# Patient Record
Sex: Female | Born: 1960 | Race: White | Hispanic: No | Smoking: Never smoker
Health system: Southern US, Community
[De-identification: ages and names within clinical notes are randomized; demographics above are authoritative.]

## PROBLEM LIST (undated history)

## (undated) DIAGNOSIS — R519 Headache, unspecified: Secondary | ICD-10-CM

## (undated) DIAGNOSIS — R51 Headache: Secondary | ICD-10-CM

## (undated) DIAGNOSIS — D649 Anemia, unspecified: Secondary | ICD-10-CM

## (undated) DIAGNOSIS — J45909 Unspecified asthma, uncomplicated: Secondary | ICD-10-CM

## (undated) DIAGNOSIS — Z87442 Personal history of urinary calculi: Secondary | ICD-10-CM

## (undated) DIAGNOSIS — T4145XA Adverse effect of unspecified anesthetic, initial encounter: Secondary | ICD-10-CM

## (undated) DIAGNOSIS — K219 Gastro-esophageal reflux disease without esophagitis: Secondary | ICD-10-CM

## (undated) DIAGNOSIS — T8859XA Other complications of anesthesia, initial encounter: Secondary | ICD-10-CM

## (undated) DIAGNOSIS — I10 Essential (primary) hypertension: Secondary | ICD-10-CM

## (undated) DIAGNOSIS — Z9889 Other specified postprocedural states: Secondary | ICD-10-CM

## (undated) DIAGNOSIS — C801 Malignant (primary) neoplasm, unspecified: Secondary | ICD-10-CM

## (undated) DIAGNOSIS — R112 Nausea with vomiting, unspecified: Secondary | ICD-10-CM

## (undated) DIAGNOSIS — M1990 Unspecified osteoarthritis, unspecified site: Secondary | ICD-10-CM

## (undated) HISTORY — PX: MOHS SURGERY: SUR867

---

## 2004-11-30 ENCOUNTER — Ambulatory Visit: Payer: Self-pay | Admitting: Family Medicine

## 2004-12-04 ENCOUNTER — Ambulatory Visit: Payer: Self-pay | Admitting: Family Medicine

## 2006-03-05 ENCOUNTER — Observation Stay: Payer: Self-pay | Admitting: Obstetrics and Gynecology

## 2006-04-23 ENCOUNTER — Observation Stay: Payer: Self-pay | Admitting: Obstetrics and Gynecology

## 2006-04-29 ENCOUNTER — Observation Stay: Payer: Self-pay | Admitting: Obstetrics and Gynecology

## 2006-05-05 ENCOUNTER — Observation Stay: Payer: Self-pay | Admitting: Obstetrics and Gynecology

## 2006-05-10 ENCOUNTER — Observation Stay: Payer: Self-pay | Admitting: Obstetrics and Gynecology

## 2006-05-17 ENCOUNTER — Observation Stay: Payer: Self-pay | Admitting: Obstetrics and Gynecology

## 2006-05-18 ENCOUNTER — Inpatient Hospital Stay: Payer: Self-pay | Admitting: Obstetrics and Gynecology

## 2006-05-21 ENCOUNTER — Inpatient Hospital Stay: Payer: Self-pay | Admitting: Obstetrics and Gynecology

## 2008-01-09 ENCOUNTER — Ambulatory Visit: Payer: Self-pay | Admitting: Internal Medicine

## 2008-03-31 ENCOUNTER — Ambulatory Visit: Payer: Self-pay

## 2008-05-27 ENCOUNTER — Emergency Department: Payer: Self-pay | Admitting: Emergency Medicine

## 2008-12-24 ENCOUNTER — Emergency Department: Payer: Self-pay | Admitting: Emergency Medicine

## 2009-11-09 ENCOUNTER — Inpatient Hospital Stay: Payer: Self-pay | Admitting: Internal Medicine

## 2009-11-29 ENCOUNTER — Ambulatory Visit: Payer: Self-pay | Admitting: Family Medicine

## 2010-05-20 ENCOUNTER — Emergency Department: Payer: Self-pay | Admitting: Emergency Medicine

## 2011-11-06 ENCOUNTER — Ambulatory Visit: Payer: Self-pay

## 2012-07-30 ENCOUNTER — Ambulatory Visit: Payer: Self-pay | Admitting: Family Medicine

## 2012-09-29 ENCOUNTER — Ambulatory Visit: Payer: Self-pay | Admitting: Obstetrics and Gynecology

## 2012-09-29 LAB — GLUCOSE, RANDOM: Glucose: 117 mg/dL — ABNORMAL HIGH (ref 65–99)

## 2012-11-19 ENCOUNTER — Ambulatory Visit: Payer: Self-pay

## 2013-08-25 ENCOUNTER — Emergency Department: Payer: Self-pay | Admitting: Emergency Medicine

## 2013-08-25 ENCOUNTER — Ambulatory Visit: Payer: Self-pay | Admitting: Family Medicine

## 2013-08-25 LAB — CBC
HCT: 41.1 % (ref 35.0–47.0)
HGB: 13.8 g/dL (ref 12.0–16.0)
MCH: 29.3 pg (ref 26.0–34.0)
MCHC: 33.6 g/dL (ref 32.0–36.0)
MCV: 87 fL (ref 80–100)
PLATELETS: 225 10*3/uL (ref 150–440)
RBC: 4.71 10*6/uL (ref 3.80–5.20)
RDW: 13 % (ref 11.5–14.5)
WBC: 6.8 10*3/uL (ref 3.6–11.0)

## 2013-08-25 LAB — BASIC METABOLIC PANEL
Anion Gap: 4 — ABNORMAL LOW (ref 7–16)
BUN: 9 mg/dL (ref 7–18)
Calcium, Total: 8.4 mg/dL — ABNORMAL LOW (ref 8.5–10.1)
Chloride: 103 mmol/L (ref 98–107)
Co2: 28 mmol/L (ref 21–32)
Creatinine: 0.69 mg/dL (ref 0.60–1.30)
EGFR (Non-African Amer.): >60
GLUCOSE: 86 mg/dL (ref 65–99)
Osmolality: 268 (ref 275–301)
POTASSIUM: 3.6 mmol/L (ref 3.5–5.1)
SODIUM: 135 mmol/L — AB (ref 136–145)

## 2013-08-25 LAB — TROPONIN I

## 2016-10-15 NOTE — H&P (Signed)
Makayla Gonzalez returns for follow up from initial possible PMB visit.  Has had sequential 39mo amenorrhea cycles and a heavy long bleed each time.  This time the bleeding lasted several weeks and finally resolved over the weekend. We drew menopause labs:  Component     Latest Ref Rng & Units 10/11/2016  WBC     4.1 - 10.2 10^3/uL 9.7  RBC     4.04 - 5.48 10^6/uL 4.89  Hemoglobin     12.0 - 15.0 gm/dL 14.6  Hematocrit     35.0 - 47.0 % 42.4  MCV     80.0 - 100.0 fl 86.7  MCH (Mean Corpuscular Hemoglobin)     27.0 - 31.2 pg 29.9  MCHC     32.0 - 36.0 gm/dL 34.4  Platelet Count     150 - 450 10^3/uL 324  RDW-CV (Red Cell Distribution Width)     11.6 - 14.8 % 13.2  MPV     9.4 - 12.4 fl 10.2  Neutrophils     1.50 - 7.80 10^3/uL 5.29  Lymphocyte Count     1.00 - 3.60 10^3/uL 3.48  Monocyte Count     0.00 - 1.50 10^3/uL 0.67  Eosinophils     0.00 - 0.55 10^3/uL 0.19  Basophils     0.00 - 0.09 10^3/uL 0.07  Neutrophil %     32.0 - 70.0 % 54.3  Lymphocyte %     10.0 - 50.0 % 35.8  Monocyte %     4.0 - 13.0 % 6.9  Eosinophil %     1.0 - 5.0 % 2.0  Basophil%     0.0 - 2.0 % 0.7  Immature Granulocyte %     <=0.7 % 0.3  Immature Granulocyte Count     <=0.06 10:3/L 0.03  LH - [facility]     mIU/mL 36.7  FSH - [facility]     mIU/mL 55.1  HCG Quant, Serum Pregnancy     <5.0 mIU/ml 3.8  TSH     0.450-5.330 uIU/ml uIU/mL 2.806   These show she is likely in menopause.  TVUS today shows: Anteverted 8 x 4 x 4cm uterus, with a 3x2x3cm ant fibroid and 1.24cm EE LO 2x2x1cm RO 2x1x1cm No ff Bilateral adnexa without masses                         Problem List Date Reviewed:07/08/15      Codes Priority Class Noted - Resolved   Abdominal cramping, unspecified ICD-10-CM: R10.9 ICD-9-CM: 789.00   06/02/2015 - Present   GERD without esophagitis ICD-10-CM: K21.9 ICD-9-CM: 530.81   06/02/2015 - Present   Bloody diarrhea, unspecified ICD-10-CM:  R19.7 ICD-9-CM: 787.91   06/02/2015 - Present   Anxiety and depression ICD-10-CM: F41.9, F32.9 ICD-9-CM: 300.00, 311   03/08/2013 - Present   Hypertension ICD-10-CM: I10 ICD-9-CM: 401.9   Unknown - Present   Cancer- basal cell ICD-10-CM: C80.1 ICD-9-CM: 199.1   Unknown - Present   Overview Signed 03/05/2013 11:06 AM by Denyse Amass, MD    basal cell has had mohs surgery      Asthma, unspecified ICD-10-CM: J45.909 ICD-9-CM: 493.90   03/05/2013 - Present   Overview Signed 03/05/2013 11:09 AM by Denyse Amass, MD    Dr. Chancy Milroy, pulmonogy       RESOLVED: Diabetes mellitus type 2, uncomplicated (CMS-HCC) TGG-26-RS: E11.9 ICD-9-CM: 250.00   Unknown - 05/30/2015      Past Medical History:has a  past medical history of Cancer (CMS-HCC); Claustrophobia; Depression, unspecified; History of kidney stones (2011); History of motion sickness; History of snoring; Hypertension; Morbid obesity with BMI of 45.0-49.9, adult (CMS-HCC); and Wears glasses. Past Surgical History:has a past surgical history that includes Cesarean section; Mohs surgery; right foot skin lesion removal; wisdom teeth extraction; esophagogastrodoudenoscopy w/biopsy (N/A, 06/17/2015); colonoscopy w/biopsy (N/A, 06/17/2015); and Tubal ligation. Family History:family history includes Alzheimer's disease in her mother; Coronary Artery Disease (Blocked arteries around heart) in her father; Diabetes type II in her father; High blood pressure (Hypertension) in her mother; Skin cancer in her mother. Social History:reports that she has never smoked. She has never used smokeless tobacco. She reports that she does not drink alcohol or use drugs. OB/GYN History:         OB History   Gravida Para Term Preterm AB Living   2 2 2   2    SAB TAB Ectopic Molar Multiple Live Births        2      Allergies:has No Known Allergies. Medications:  Current Outpatient  Prescriptions:  . albuterol 90 mcg/actuation inhaler, Inhale 2 inhalations into the lungs every 6 (six) hours as needed for Wheezing. Reported on 06/10/2015 , Disp: , Rfl:  . cetirizine (ZYRTEC) 10 mg capsule, Take 10 mg by mouth as needed. Reported on 06/10/2015 , Disp: , Rfl:  . FLUoxetine (PROZAC) 40 MG capsule, Take 80 mg by mouth once daily., Disp: , Rfl:  . fluticasone-salmeterol (ADVAIR DISKUS) 250-50 mcg/dose diskus inhaler, Inhale 1 inhalation into the lungs every 12 (twelve) hours. Per dr. Chancy Milroy, intermittently, Disp: 1 Inhaler, Rfl: 5 . losartan (COZAAR) 50 MG tablet, TAKE 1 TABLET BY MOUTH EVERY DAY, Disp: 100 tablet, Rfl: 0 . omeprazole (PRILOSEC OTC) 20 MG tablet, Take 20 mg by mouth 2 (two) times daily., Disp: , Rfl:  . FLUoxetine (PROZAC) 40 MG capsule, Take 1 capsule (40 mg total) by mouth 2 (two) times daily. MUST MAKE APPT FOR ANY FURTHER REFILLS., Disp: 60 capsule, Rfl: 0 . losartan (COZAAR) 50 MG tablet, Take 1 tablet (50 mg total) by mouth once daily., Disp: 30 tablet, Rfl: 3  Review of Systems: General: No fatigue or weightloss Eyes:No vision changes Ears:No hearing difficulty Respiratory:No cough or shortness of breath Pulmonary: No asthma or shortness of breath Cardiovascular:No chest pain, palpitations, dyspnea on exertion Gastrointestinal:No abdominal bloating, chronic diarrhea, constipation, masses, pain or hematochezia Genitourinary:No hematuria, dysuria, abnormal vaginal discharge, pelvic pain, Menometrorrhagia Lymphatic:No swollen lymph nodes Musculoskeletal:No muscle weakness Neurologic:No extremity weakness, syncope, seizure disorder Psychiatric:No history of depression, delusions or suicidal/homicidal ideation      BP 134/64   Pulse (!)  112   Ht 162.6 cm (5\' 4" )   Wt (!) 130.9 kg (288 lb 9.6 oz)   BMI 49.54 kg/m    WDWN white female in NAD   Lungs: CTA  CV : RRR without murmur   Abdomen: soft , no mass, normal active bowel sounds,  non-tender, no rebound tenderness Pelvic: tanner stage 5 ,              External genitalia: vulva /labia no lesions             Urethra: no prolapse, no diverticulum, no caruncle             Bladder: no tenderness to palpation, no cystocele             Vagina: normal physiologic d/c, no lesions, normal apical support  Cervix: no lesions, no cervical motion tenderness               Uterus: normal size shape and contour, non-tender, mobile, exam limited by habitus             Adnexa: no masses bilaterally, non-tender  exam limited by habitus                Endometrial biopsy done today  Assessment: 55yo with PMB  Plan: She is at wits end and does not want to experience this cycle again.  She would like a hysterectomy; many of her sisters have had them and she is confident this is what she wants.   EMB collected today and will determine if there is any concern for malignancy.  I'm happy to offer her a hysterectomy and BS, citing patient autonomy.  We discussed the all-cause mortality of removing her ovaries prior to age 61.  She would like to discuss with her family before making this part of the decision, but would like to proceed with scheduling the surgery.  The patient and I discussed the technical aspects of the procedure including the potential for risks and complications.These include but are not limited to the risk of infection requiring post-operative antibiotics or further procedures.We talked about the risk of injury to adjacent organs including bladder, bowel, ureter, blood vessels or nerves, the need to convert to an open incision, possibleneed for blood transfusion andpostop complications such asthromboembolic or cardiopulmonary complications.All of her  questions were answered. Her preoperative exam was completed andthe appropriate consents were signed. She is scheduled to undergo this procedure in the near future.   Urbano Milhouse CRIST Reem Fleury, MD

## 2016-10-17 ENCOUNTER — Encounter
Admission: RE | Admit: 2016-10-17 | Discharge: 2016-10-17 | Disposition: A | Discharge status: Home / Self Care | Payer: BLUE CROSS/BLUE SHIELD | Source: Ambulatory Visit | Attending: Obstetrics & Gynecology | Admitting: Obstetrics & Gynecology

## 2016-10-17 ENCOUNTER — Encounter: Payer: Self-pay | Admitting: *Deleted

## 2016-10-17 DIAGNOSIS — I4581 Long QT syndrome: Secondary | ICD-10-CM | POA: Insufficient documentation

## 2016-10-17 DIAGNOSIS — Z01818 Encounter for other preprocedural examination: Secondary | ICD-10-CM | POA: Insufficient documentation

## 2016-10-17 DIAGNOSIS — N950 Postmenopausal bleeding: Secondary | ICD-10-CM | POA: Insufficient documentation

## 2016-10-17 DIAGNOSIS — I10 Essential (primary) hypertension: Secondary | ICD-10-CM | POA: Insufficient documentation

## 2016-10-17 DIAGNOSIS — N938 Other specified abnormal uterine and vaginal bleeding: Secondary | ICD-10-CM | POA: Insufficient documentation

## 2016-10-17 HISTORY — DX: Other specified postprocedural states: Z98.890

## 2016-10-17 HISTORY — DX: Headache: R51

## 2016-10-17 HISTORY — DX: Malignant (primary) neoplasm, unspecified: C80.1

## 2016-10-17 HISTORY — DX: Other complications of anesthesia, initial encounter: T88.59XA

## 2016-10-17 HISTORY — DX: Gastro-esophageal reflux disease without esophagitis: K21.9

## 2016-10-17 HISTORY — DX: Unspecified osteoarthritis, unspecified site: M19.90

## 2016-10-17 HISTORY — DX: Anemia, unspecified: D64.9

## 2016-10-17 HISTORY — DX: Unspecified asthma, uncomplicated: J45.909

## 2016-10-17 HISTORY — DX: Nausea with vomiting, unspecified: R11.2

## 2016-10-17 HISTORY — DX: Adverse effect of unspecified anesthetic, initial encounter: T41.45XA

## 2016-10-17 HISTORY — DX: Personal history of urinary calculi: Z87.442

## 2016-10-17 HISTORY — DX: Essential (primary) hypertension: I10

## 2016-10-17 HISTORY — DX: Headache, unspecified: R51.9

## 2016-10-17 NOTE — Patient Instructions (Signed)
  Your procedure is scheduled on: 10-19-16 (Friday) Report to Same Day Surgery 2nd floor medical mall [facility] Entrance-take elevator on left to 2nd floor.  Check in with surgery information desk.) To find out your arrival time please call [phone] between 1PM - 3PM on 10-18-16 (Thursday)  Remember: Instructions that are not followed completely may result in serious medical risk, up to and including death, or upon the discretion of your surgeon and anesthesiologist your surgery may need to be rescheduled.    _x___ 1. Do not eat food or drink liquids after midnight. No gum chewing or hard candies.     __x__ 2. No Alcohol for 24 hours before or after surgery.   __x__3. No Smoking for 24 prior to surgery.   ____  4. Bring all medications with you on the day of surgery if instructed.    __x__ 5. Notify your doctor if there is any change in your medical condition     (cold, fever, infections).     Do not wear jewelry, make-up, hairpins, clips or nail polish.  Do not wear lotions, powders, or perfumes. You may wear deodorant.  Do not shave 48 hours prior to surgery. Men may shave face and neck.  Do not bring valuables to the hospital.    [facility] is not responsible for any belongings or valuables.               Contacts, dentures or bridgework may not be worn into surgery.  Leave your suitcase in the car. After surgery it may be brought to your room.  For patients admitted to the hospital, discharge time is determined by your treatment team.   Patients discharged the day of surgery will not be allowed to drive home.  You will need someone to drive you home and stay with you the night of your procedure.    Please read over the following fact sheets that you were given:     _x___ [place] WITH A SMALL SIP OF WATER. These include:  1. PROZAC  2. LOSARTAN  3. PRILOSEC  4. TAKE AN EXTRA PRILOSEC ON Thursday NIGHT BEFORE  BED  5.  6.  ____Fleets enema or Magnesium Citrate as directed.   _x___ Use CHG Soap or sage wipes as directed on instruction sheet   _X___ Use inhalers on the day of surgery and bring to hospital day of surgery-USE ALBUTEROL INHALER AT [place]  ____ Stop Metformin and Janumet 2 days prior to surgery.    ____ Take 1/2 of usual insulin dose the night before surgery and none on the morning surgery.   ____ Follow recommendations from Cardiologist, Pulmonologist or PCP regarding stopping Aspirin, Coumadin, Pllavix ,Eliquis, Effient, or Pradaxa, and Pletal.  X____Stop Anti-inflammatories such as Advil, Aleve, Ibuprofen, Motrin, Naproxen, Naprosyn, Goodies powders or aspirin products NOW-OK to take Tylenol    ____ Stop supplements until after surgery.    ____ Bring C-Pap to the hospital.

## 2016-10-18 ENCOUNTER — Encounter
Admission: RE | Admit: 2016-10-18 | Discharge: 2016-10-18 | Disposition: A | Discharge status: Home / Self Care | Payer: BLUE CROSS/BLUE SHIELD | Source: Ambulatory Visit | Attending: Obstetrics & Gynecology | Admitting: Obstetrics & Gynecology

## 2016-10-18 DIAGNOSIS — Z01818 Encounter for other preprocedural examination: Secondary | ICD-10-CM | POA: Diagnosis present

## 2016-10-18 DIAGNOSIS — N950 Postmenopausal bleeding: Secondary | ICD-10-CM | POA: Diagnosis not present

## 2016-10-18 DIAGNOSIS — N938 Other specified abnormal uterine and vaginal bleeding: Secondary | ICD-10-CM | POA: Diagnosis not present

## 2016-10-18 DIAGNOSIS — I10 Essential (primary) hypertension: Secondary | ICD-10-CM | POA: Diagnosis not present

## 2016-10-18 DIAGNOSIS — I4581 Long QT syndrome: Secondary | ICD-10-CM | POA: Diagnosis not present

## 2016-10-18 LAB — BASIC METABOLIC PANEL
Anion gap: 10 (ref 5–15)
BUN: 10 mg/dL (ref 6–20)
CHLORIDE: 100 mmol/L — AB (ref 101–111)
CO2: 28 mmol/L (ref 22–32)
Calcium: 8.8 mg/dL — ABNORMAL LOW (ref 8.9–10.3)
Creatinine, Ser: 0.78 mg/dL (ref 0.44–1.00)
GFR calc Af Amer: >60 mL/min (ref >60)
GFR calc non Af Amer: >60 mL/min (ref >60)
GLUCOSE: 117 mg/dL — AB (ref 65–99)
POTASSIUM: 3.7 mmol/L (ref 3.5–5.1)
Sodium: 138 mmol/L (ref 135–145)

## 2016-10-18 LAB — TYPE AND SCREEN
ABO/RH(D): O POS
Antibody Screen: NEGATIVE

## 2016-10-18 LAB — CBC
HEMATOCRIT: 39.9 % (ref 35.0–47.0)
Hemoglobin: 13.5 g/dL (ref 12.0–16.0)
MCH: 29.2 pg (ref 26.0–34.0)
MCHC: 33.8 g/dL (ref 32.0–36.0)
MCV: 86.5 fL (ref 80.0–100.0)
Platelets: 229 10*3/uL (ref 150–440)
RBC: 4.61 MIL/uL (ref 3.80–5.20)
RDW: 14.1 % (ref 11.5–14.5)
WBC: 5.1 10*3/uL (ref 3.6–11.0)

## 2016-10-18 MED ORDER — CEFAZOLIN SODIUM-DEXTROSE 2-4 GM/100ML-% IV SOLN: 2.0000 g | Freq: Once | INTRAVENOUS | Status: DC

## 2016-10-18 NOTE — Pre-Procedure Instructions (Signed)
CALLED DR Rosey Bath ABOUT ABNORMAL EKG- DR Rosey Bath COMPARED TODAYS EKG WITH 2015 EKG AND SAID THAT PT IS OK TO PROCEED WITH SURGERY

## 2016-10-19 MED ORDER — HEPARIN SODIUM (PORCINE) 5000 UNIT/ML IJ SOLN
INTRAMUSCULAR | Status: AC
  Filled 2016-10-19: qty 1

## 2016-10-19 MED ORDER — CELECOXIB 200 MG PO CAPS
ORAL_CAPSULE | ORAL | Status: AC
  Filled 2016-10-19: qty 2

## 2016-10-19 MED ORDER — CEFAZOLIN SODIUM-DEXTROSE 2-4 GM/100ML-% IV SOLN
INTRAVENOUS | Status: AC
  Filled 2016-10-19: qty 100

## 2016-10-19 MED ORDER — GABAPENTIN 300 MG PO CAPS
ORAL_CAPSULE | ORAL | Status: AC
  Filled 2016-10-19: qty 2

## 2016-10-19 MED ORDER — ACETAMINOPHEN 500 MG PO TABS
ORAL_TABLET | ORAL | Status: AC
  Filled 2016-10-19: qty 2

## 2016-10-26 ENCOUNTER — Ambulatory Visit: Payer: BLUE CROSS/BLUE SHIELD | Admitting: Anesthesiology

## 2016-10-26 ENCOUNTER — Encounter
Admission: RE | Disposition: A | Discharge status: Home / Self Care | Payer: Self-pay | Source: Ambulatory Visit | Attending: Obstetrics & Gynecology

## 2016-10-26 ENCOUNTER — Ambulatory Visit
Admission: RE | Admit: 2016-10-26 | Discharge: 2016-10-26 | Disposition: A | Discharge status: Home / Self Care | Payer: BLUE CROSS/BLUE SHIELD | Source: Ambulatory Visit | Attending: Obstetrics & Gynecology | Admitting: Obstetrics & Gynecology

## 2016-10-26 ENCOUNTER — Encounter: Payer: Self-pay | Admitting: *Deleted

## 2016-10-26 DIAGNOSIS — Z87442 Personal history of urinary calculi: Secondary | ICD-10-CM | POA: Insufficient documentation

## 2016-10-26 DIAGNOSIS — Z6841 Body Mass Index (BMI) 40.0 and over, adult: Secondary | ICD-10-CM | POA: Diagnosis not present

## 2016-10-26 DIAGNOSIS — N888 Other specified noninflammatory disorders of cervix uteri: Secondary | ICD-10-CM | POA: Insufficient documentation

## 2016-10-26 DIAGNOSIS — Z79899 Other long term (current) drug therapy: Secondary | ICD-10-CM | POA: Diagnosis not present

## 2016-10-26 DIAGNOSIS — N950 Postmenopausal bleeding: Secondary | ICD-10-CM | POA: Diagnosis present

## 2016-10-26 DIAGNOSIS — N83209 Unspecified ovarian cyst, unspecified side: Secondary | ICD-10-CM | POA: Diagnosis not present

## 2016-10-26 DIAGNOSIS — J45909 Unspecified asthma, uncomplicated: Secondary | ICD-10-CM | POA: Insufficient documentation

## 2016-10-26 DIAGNOSIS — N72 Inflammatory disease of cervix uteri: Secondary | ICD-10-CM | POA: Insufficient documentation

## 2016-10-26 DIAGNOSIS — M1990 Unspecified osteoarthritis, unspecified site: Secondary | ICD-10-CM | POA: Diagnosis not present

## 2016-10-26 DIAGNOSIS — D259 Leiomyoma of uterus, unspecified: Secondary | ICD-10-CM | POA: Diagnosis not present

## 2016-10-26 DIAGNOSIS — K219 Gastro-esophageal reflux disease without esophagitis: Secondary | ICD-10-CM | POA: Insufficient documentation

## 2016-10-26 DIAGNOSIS — N840 Polyp of corpus uteri: Secondary | ICD-10-CM | POA: Diagnosis not present

## 2016-10-26 DIAGNOSIS — F329 Major depressive disorder, single episode, unspecified: Secondary | ICD-10-CM | POA: Insufficient documentation

## 2016-10-26 DIAGNOSIS — F419 Anxiety disorder, unspecified: Secondary | ICD-10-CM | POA: Insufficient documentation

## 2016-10-26 DIAGNOSIS — I10 Essential (primary) hypertension: Secondary | ICD-10-CM | POA: Diagnosis not present

## 2016-10-26 DIAGNOSIS — N838 Other noninflammatory disorders of ovary, fallopian tube and broad ligament: Secondary | ICD-10-CM | POA: Diagnosis not present

## 2016-10-26 DIAGNOSIS — Z7951 Long term (current) use of inhaled steroids: Secondary | ICD-10-CM | POA: Diagnosis not present

## 2016-10-26 HISTORY — PX: LAPAROSCOPIC HYSTERECTOMY: SHX1926

## 2016-10-26 HISTORY — PX: LAPAROSCOPIC BILATERAL SALPINGO OOPHERECTOMY: SHX5890

## 2016-10-26 HISTORY — PX: CYSTOSCOPY: SHX5120

## 2016-10-26 LAB — POCT PREGNANCY, URINE: Preg Test, Ur: NEGATIVE

## 2016-10-26 LAB — ABO/RH: ABO/RH(D): O POS

## 2016-10-26 SURGERY — HYSTERECTOMY, TOTAL, LAPAROSCOPIC
Anesthesia: General | Wound class: Clean Contaminated

## 2016-10-26 MED ORDER — HEPARIN SODIUM (PORCINE) 5000 UNIT/ML IJ SOLN
5000.0000 [IU] | Freq: Once | INTRAMUSCULAR | Status: AC
  Administered 2016-10-26: 5000 [IU] via SUBCUTANEOUS

## 2016-10-26 MED ORDER — LACTATED RINGERS IV SOLN
INTRAVENOUS | Status: DC
  Administered 2016-10-26: 07:00:00 via INTRAVENOUS

## 2016-10-26 MED ORDER — GABAPENTIN 600 MG PO TABS
600.0000 mg | ORAL_TABLET | Freq: Once | ORAL | Status: AC
  Filled 2016-10-26: qty 1

## 2016-10-26 MED ORDER — KETOROLAC TROMETHAMINE 30 MG/ML IJ SOLN
INTRAMUSCULAR | Status: AC
  Filled 2016-10-26: qty 1

## 2016-10-26 MED ORDER — ROCURONIUM BROMIDE 50 MG/5ML IV SOLN
INTRAVENOUS | Status: AC
  Filled 2016-10-26: qty 1

## 2016-10-26 MED ORDER — SCOPOLAMINE 1 MG/3DAYS TD PT72
MEDICATED_PATCH | TRANSDERMAL | Status: AC
  Administered 2016-10-26: 1.5 mg
  Filled 2016-10-26: qty 1

## 2016-10-26 MED ORDER — FENTANYL CITRATE (PF) 100 MCG/2ML IJ SOLN
25.0000 ug | INTRAMUSCULAR | Status: DC | PRN
  Administered 2016-10-26: 25 ug via INTRAVENOUS
  Administered 2016-10-26: 50 ug via INTRAVENOUS
  Administered 2016-10-26: 25 ug via INTRAVENOUS
  Administered 2016-10-26: 50 ug via INTRAVENOUS

## 2016-10-26 MED ORDER — CEFAZOLIN SODIUM-DEXTROSE 2-4 GM/100ML-% IV SOLN
2.0000 g | Freq: Once | INTRAVENOUS | Status: AC
  Administered 2016-10-26: 2 g via INTRAVENOUS

## 2016-10-26 MED ORDER — EVICEL 5 ML EX KIT
PACK | CUTANEOUS | Status: DC | PRN
  Administered 2016-10-26: 5 mL

## 2016-10-26 MED ORDER — OXYCODONE HCL 5 MG PO TABS
5.0000 mg | ORAL_TABLET | Freq: Once | ORAL | Status: AC | PRN
  Administered 2016-10-26: 5 mg via ORAL

## 2016-10-26 MED ORDER — CELECOXIB 200 MG PO CAPS
400.0000 mg | ORAL_CAPSULE | Freq: Once | ORAL | Status: AC
  Administered 2016-10-26: 400 mg via ORAL

## 2016-10-26 MED ORDER — HEPARIN SODIUM (PORCINE) 5000 UNIT/ML IJ SOLN
INTRAMUSCULAR | Status: AC
  Administered 2016-10-26: 5000 [IU] via SUBCUTANEOUS
  Filled 2016-10-26: qty 1

## 2016-10-26 MED ORDER — PROPOFOL 10 MG/ML IV BOLUS
INTRAVENOUS | Status: DC | PRN
  Administered 2016-10-26: 150 mg via INTRAVENOUS

## 2016-10-26 MED ORDER — FENTANYL CITRATE (PF) 100 MCG/2ML IJ SOLN
INTRAMUSCULAR | Status: AC
  Administered 2016-10-26: 25 ug via INTRAVENOUS
  Filled 2016-10-26: qty 2

## 2016-10-26 MED ORDER — ACETAMINOPHEN 650 MG RE SUPP
650.0000 mg | RECTAL | Status: DC | PRN
  Filled 2016-10-26: qty 1

## 2016-10-26 MED ORDER — IBUPROFEN 600 MG PO TABS: 600.0000 mg | ORAL_TABLET | Freq: Four times a day (QID) | ORAL | 1 refills | Status: DC

## 2016-10-26 MED ORDER — OXYCODONE HCL 5 MG PO TABS
ORAL_TABLET | ORAL | Status: AC
  Filled 2016-10-26: qty 1

## 2016-10-26 MED ORDER — DEXAMETHASONE SODIUM PHOSPHATE 10 MG/ML IJ SOLN
INTRAMUSCULAR | Status: DC | PRN
  Administered 2016-10-26: 10 mg via INTRAVENOUS

## 2016-10-26 MED ORDER — CELECOXIB 200 MG PO CAPS
ORAL_CAPSULE | ORAL | Status: AC
  Administered 2016-10-26: 400 mg via ORAL
  Filled 2016-10-26: qty 2

## 2016-10-26 MED ORDER — ONDANSETRON HCL 4 MG/2ML IJ SOLN
INTRAMUSCULAR | Status: DC | PRN
  Administered 2016-10-26: 4 mg via INTRAVENOUS

## 2016-10-26 MED ORDER — SUCCINYLCHOLINE CHLORIDE 20 MG/ML IJ SOLN
INTRAMUSCULAR | Status: DC | PRN
  Administered 2016-10-26: 100 mg via INTRAVENOUS

## 2016-10-26 MED ORDER — KETOROLAC TROMETHAMINE 30 MG/ML IJ SOLN
INTRAMUSCULAR | Status: DC | PRN
  Administered 2016-10-26: 15 mg via INTRAVENOUS

## 2016-10-26 MED ORDER — CEFAZOLIN SODIUM-DEXTROSE 2-4 GM/100ML-% IV SOLN
INTRAVENOUS | Status: AC
  Filled 2016-10-26: qty 100

## 2016-10-26 MED ORDER — OXYCODONE HCL 5 MG PO TABS: 5.0000 mg | ORAL_TABLET | ORAL | 0 refills | Status: DC | PRN

## 2016-10-26 MED ORDER — LIDOCAINE HCL (CARDIAC) 20 MG/ML IV SOLN
INTRAVENOUS | Status: DC | PRN
  Administered 2016-10-26: 50 mg via INTRAVENOUS

## 2016-10-26 MED ORDER — ACETAMINOPHEN 500 MG PO TABS
1000.0000 mg | ORAL_TABLET | Freq: Once | ORAL | Status: AC
  Administered 2016-10-26: 1000 mg via ORAL

## 2016-10-26 MED ORDER — PROMETHAZINE HCL 25 MG/ML IJ SOLN: 6.2500 mg | INTRAMUSCULAR | Status: DC | PRN

## 2016-10-26 MED ORDER — ACETAMINOPHEN NICU IV SYRINGE 10 MG/ML
INTRAVENOUS | Status: AC
  Filled 2016-10-26: qty 1

## 2016-10-26 MED ORDER — ONDANSETRON HCL 4 MG/2ML IJ SOLN
INTRAMUSCULAR | Status: AC
  Filled 2016-10-26: qty 2

## 2016-10-26 MED ORDER — PROPOFOL 10 MG/ML IV BOLUS
INTRAVENOUS | Status: AC
  Filled 2016-10-26: qty 20

## 2016-10-26 MED ORDER — MIDAZOLAM HCL 2 MG/2ML IJ SOLN
INTRAMUSCULAR | Status: DC | PRN
  Administered 2016-10-26: 2 mg via INTRAVENOUS

## 2016-10-26 MED ORDER — FENTANYL CITRATE (PF) 100 MCG/2ML IJ SOLN
INTRAMUSCULAR | Status: DC | PRN
  Administered 2016-10-26 (×4): 25 ug via INTRAVENOUS

## 2016-10-26 MED ORDER — DEXAMETHASONE SODIUM PHOSPHATE 10 MG/ML IJ SOLN
INTRAMUSCULAR | Status: AC
  Filled 2016-10-26: qty 1

## 2016-10-26 MED ORDER — FENTANYL CITRATE (PF) 100 MCG/2ML IJ SOLN
INTRAMUSCULAR | Status: AC
  Filled 2016-10-26: qty 2

## 2016-10-26 MED ORDER — SUGAMMADEX SODIUM 200 MG/2ML IV SOLN
INTRAVENOUS | Status: AC
  Filled 2016-10-26: qty 2

## 2016-10-26 MED ORDER — SUGAMMADEX SODIUM 200 MG/2ML IV SOLN
INTRAVENOUS | Status: DC | PRN
  Administered 2016-10-26: 200 mg via INTRAVENOUS

## 2016-10-26 MED ORDER — MIDAZOLAM HCL 2 MG/2ML IJ SOLN
INTRAMUSCULAR | Status: AC
  Filled 2016-10-26: qty 2

## 2016-10-26 MED ORDER — OXYCODONE HCL 5 MG/5ML PO SOLN: 5.0000 mg | Freq: Once | ORAL | Status: AC | PRN

## 2016-10-26 MED ORDER — ACETAMINOPHEN 500 MG PO TABS: 1000.0000 mg | ORAL_TABLET | Freq: Four times a day (QID) | ORAL | 0 refills | Status: DC

## 2016-10-26 MED ORDER — ACETAMINOPHEN 500 MG PO TABS
ORAL_TABLET | ORAL | Status: AC
  Administered 2016-10-26: 1000 mg via ORAL
  Filled 2016-10-26: qty 2

## 2016-10-26 MED ORDER — GABAPENTIN 300 MG PO CAPS
ORAL_CAPSULE | ORAL | Status: AC
  Administered 2016-10-26: 600 mg
  Filled 2016-10-26: qty 2

## 2016-10-26 MED ORDER — EPHEDRINE SULFATE 50 MG/ML IJ SOLN
INTRAMUSCULAR | Status: DC | PRN
  Administered 2016-10-26 (×2): 10 mg via INTRAVENOUS

## 2016-10-26 MED ORDER — SUCCINYLCHOLINE CHLORIDE 20 MG/ML IJ SOLN
INTRAMUSCULAR | Status: AC
  Filled 2016-10-26: qty 1

## 2016-10-26 MED ORDER — ROCURONIUM BROMIDE 100 MG/10ML IV SOLN
INTRAVENOUS | Status: DC | PRN
Start: 2016-10-26 — End: 2016-10-26
  Administered 2016-10-26: 10 mg via INTRAVENOUS
  Administered 2016-10-26: 40 mg via INTRAVENOUS
  Administered 2016-10-26: 10 mg via INTRAVENOUS

## 2016-10-26 MED ORDER — PROPOFOL 500 MG/50ML IV EMUL
INTRAVENOUS | Status: DC | PRN
  Administered 2016-10-26: 15 ug/kg/min via INTRAVENOUS

## 2016-10-26 MED ORDER — MORPHINE SULFATE (PF) 4 MG/ML IV SOLN: 1.0000 mg | INTRAVENOUS | Status: DC | PRN

## 2016-10-26 MED ORDER — ACETAMINOPHEN 325 MG PO TABS: 650.0000 mg | ORAL_TABLET | ORAL | Status: DC | PRN

## 2016-10-26 SURGICAL SUPPLY — 71 items
BACTOSHIELD CHG 4% 4OZ (MISCELLANEOUS) ×2
BAG URO DRAIN 2000ML W/SPOUT (MISCELLANEOUS) ×5 IMPLANT
BLADE SURG SZ11 CARB STEEL (BLADE) ×5 IMPLANT
CANISTER SUCT 1200ML W/VALVE (MISCELLANEOUS) ×5 IMPLANT
CATH FOLEY 2WAY  5CC 16FR (CATHETERS) ×2
CATH ROBINSON RED A/P 16FR (CATHETERS) ×5 IMPLANT
CATH URTH 16FR FL 2W BLN LF (CATHETERS) ×3 IMPLANT
CHLORAPREP W/TINT 26ML (MISCELLANEOUS) ×10 IMPLANT
DEFOGGER SCOPE WARMER CLEARIFY (MISCELLANEOUS) ×5 IMPLANT
DERMABOND ADVANCED (GAUZE/BANDAGES/DRESSINGS) ×2
DERMABOND ADVANCED .7 DNX12 (GAUZE/BANDAGES/DRESSINGS) ×3 IMPLANT
DRAPE LEGGINS SURG 28X43 STRL (DRAPES) ×5 IMPLANT
DRAPE SHEET LG 3/4 BI-LAMINATE (DRAPES) ×5 IMPLANT
DRAPE UNDER BUTTOCK W/FLU (DRAPES) ×5 IMPLANT
DRSG TEGADERM 2-3/8X2-3/4 SM (GAUZE/BANDAGES/DRESSINGS) ×10 IMPLANT
DRSG TELFA 4X3 1S NADH ST (GAUZE/BANDAGES/DRESSINGS) ×15 IMPLANT
ENDOPOUCH RETRIEVER 10 (MISCELLANEOUS) ×5 IMPLANT
GAUZE SPONGE NON-WVN 2X2 STRL (MISCELLANEOUS) ×6 IMPLANT
GLOVE PI ORTHOPRO 6.5 (GLOVE) ×16
GLOVE PI ORTHOPRO STRL 6.5 (GLOVE) ×24 IMPLANT
GLOVE SURG SYN 6.5 ES PF (GLOVE) ×30 IMPLANT
GOWN STRL REUS W/ TWL LRG LVL3 (GOWN DISPOSABLE) ×9 IMPLANT
GOWN STRL REUS W/ TWL XL LVL3 (GOWN DISPOSABLE) ×3 IMPLANT
GOWN STRL REUS W/TWL LRG LVL3 (GOWN DISPOSABLE) ×6
GOWN STRL REUS W/TWL XL LVL3 (GOWN DISPOSABLE) ×2
GRASPER SUT TROCAR 14GX15 (MISCELLANEOUS) ×5 IMPLANT
IRRIGATION STRYKERFLOW (MISCELLANEOUS) ×3 IMPLANT
IRRIGATOR STRYKERFLOW (MISCELLANEOUS) ×5
IV LACTATED RINGERS 1000ML (IV SOLUTION) ×5 IMPLANT
KIT PINK PAD W/HEAD ARE REST (MISCELLANEOUS) ×5
KIT PINK PAD W/HEAD ARM REST (MISCELLANEOUS) ×3 IMPLANT
KIT RM TURNOVER CYSTO AR (KITS) ×5 IMPLANT
L-HOOK LAP DISP 36CM (ELECTROSURGICAL) ×5
LABEL OR SOLS (LABEL) IMPLANT
LHOOK LAP DISP 36CM (ELECTROSURGICAL) ×3 IMPLANT
LIGASURE BLUNT 5MM 37CM (INSTRUMENTS) ×5 IMPLANT
LIGASURE LAP MARYLAND 5MM 37CM (ELECTROSURGICAL) ×4 IMPLANT
LIGASURE VESSEL 5MM BLUNT TIP (ELECTROSURGICAL) ×5 IMPLANT
MANIPULATOR UTERINE 4.5 ZUMI (MISCELLANEOUS) ×5 IMPLANT
MANIPULATOR VCARE LG CRV RETR (MISCELLANEOUS) ×5 IMPLANT
MANIPULATOR VCARE SML CRV RETR (MISCELLANEOUS) ×5 IMPLANT
MANIPULATOR VCARE STD CRV RETR (MISCELLANEOUS) IMPLANT
NEEDLE VERESS 14GA 120MM (NEEDLE) ×5 IMPLANT
NS IRRIG 500ML POUR BTL (IV SOLUTION) ×5 IMPLANT
PACK LAP CHOLECYSTECTOMY (MISCELLANEOUS) ×5 IMPLANT
PAD OB MATERNITY 4.3X12.25 (PERSONAL CARE ITEMS) ×5 IMPLANT
PAD PREP 24X41 OB/GYN DISP (PERSONAL CARE ITEMS) ×5 IMPLANT
PENCIL ELECTRO HAND CTR (MISCELLANEOUS) ×5 IMPLANT
SCISSORS METZENBAUM CVD 33 (INSTRUMENTS) ×5 IMPLANT
SCRUB CHG 4% DYNA-HEX 4OZ (MISCELLANEOUS) ×3 IMPLANT
SET CYSTO W/LG BORE CLAMP LF (SET/KITS/TRAYS/PACK) ×5 IMPLANT
SET YANKAUER POOLE SUCT (MISCELLANEOUS) ×5 IMPLANT
SHEARS HARMONIC ACE PLUS 36CM (ENDOMECHANICALS) ×5 IMPLANT
SLEEVE ENDOPATH XCEL 5M (ENDOMECHANICALS) ×10 IMPLANT
SPONGE VERSALON 2X2 STRL (MISCELLANEOUS) ×4
SPONGE XRAY 4X4 16PLY STRL (MISCELLANEOUS) ×5 IMPLANT
SURGILUBE 2OZ TUBE FLIPTOP (MISCELLANEOUS) ×5 IMPLANT
SUT MNCRL 4-0 (SUTURE) ×2
SUT MNCRL 4-0 27XMFL (SUTURE) ×3
SUT MNCRL AB 4-0 PS2 18 (SUTURE) ×5 IMPLANT
SUT VIC AB 0 CT1 36 (SUTURE) ×10 IMPLANT
SUT VIC AB 2-0 UR6 27 (SUTURE) ×5 IMPLANT
SUT VIC AB 4-0 PS2 18 (SUTURE) ×5 IMPLANT
SUTURE MNCRL 4-0 27XMF (SUTURE) ×3 IMPLANT
SYRINGE 10CC LL (SYRINGE) ×5 IMPLANT
TIP RIGID 35CM EVICEL (HEMOSTASIS) ×5 IMPLANT
TROCAR 5M 150ML BLDLS (TROCAR) ×5 IMPLANT
TROCAR ENDO BLADELESS 11MM (ENDOMECHANICALS) ×5 IMPLANT
TROCAR XCEL NON-BLD 5MMX100MML (ENDOMECHANICALS) ×5 IMPLANT
TUBING INSUF HEATED (TUBING) ×5 IMPLANT
TUBING INSUFFLATOR HI FLOW (MISCELLANEOUS) ×5 IMPLANT

## 2016-10-26 NOTE — Interval H&P Note (Signed)
History and Physical Interval Note: H&P Update  PLEASE SEE PREVIOUS H&P  Pt was last seen in my office, and complete history and physical performed.  The surgical history has been reviewed and remains accurate without interval change. The patient was re-examined and patient's physiologic condition has not changed significantly in the last 30 days.  No new pharmacological allergies or types of therapy has been initiated.  No Known Allergies  Past Medical History:  Diagnosis Date  . Anemia   . Arthritis   . Asthma   . Cancer (HCC)    BASAL CELL NOSE  . Complication of anesthesia   . GERD (gastroesophageal reflux disease)   . Headache    MIGRAINES  . History of kidney stones   . Hypertension   . PONV (postoperative nausea and vomiting)    NAUSEATED   Past Surgical History:  Procedure Laterality Date  . CESAREAN SECTION    . MOHS SURGERY      BP (!) 152/75   Pulse (!) 102   Temp (!) 96.4 F (35.8 C) (Tympanic)   Resp 18   Ht 5\' 4"  (1.626 m)   Wt 130.6 kg (288 lb)   SpO2 96%   BMI 49.44 kg/m   NAD RRR no murmurs CTAB, no wheezing, resps unlabored +BS, soft, NTTP No c/c/e Pelvic exam deferred  The above history was confirmed with the patient. The condition still exists that makes this procedure necessary. Surgical plan includes TLH BSO as confirmed on the consent. The treatment plan remains the same, without new options for care.  The patient understands the potential benefits and risks and the consents have been signed and placed on the chart.     Larey Days, MD Attending Obstetrician Gynecologist [facility], Department of [facility]

## 2016-10-26 NOTE — Anesthesia Post-op Follow-up Note (Cosign Needed)
Anesthesia QCDR form completed.        

## 2016-10-26 NOTE — Anesthesia Procedure Notes (Signed)
Procedure Name: Intubation Date/Time: 10/26/2016 7:54 AM Performed by: Darlyne Russian Pre-anesthesia Checklist: Patient identified, Emergency Drugs available, Suction available, Patient being monitored and Timeout performed Patient Re-evaluated:Patient Re-evaluated prior to inductionOxygen Delivery Method: Circle system utilized Preoxygenation: Pre-oxygenation with 100% oxygen Intubation Type: IV induction Ventilation: Mask ventilation without difficulty Laryngoscope Size: Mac and 3 Grade View: Grade I Tube size: 7.0 mm Number of attempts: 1 Airway Equipment and Method: Stylet Placement Confirmation: ETT inserted through vocal cords under direct vision,  positive ETCO2 and breath sounds checked- equal and bilateral Secured at: 20 cm Tube secured with: Tape Dental Injury: Teeth and Oropharynx as per pre-operative assessment

## 2016-10-26 NOTE — Anesthesia Preprocedure Evaluation (Signed)
Anesthesia Evaluation  Patient identified by MRN, date of birth, ID band Patient awake    Reviewed: Allergy & Precautions, H&P , NPO status , Patient's Chart, lab work & pertinent test results  History of Anesthesia Complications (+) PONV and history of anesthetic complications  Airway Mallampati: II  TM Distance: >3 FB Neck ROM: full    Dental  (+) Chipped, Caps   Pulmonary neg shortness of breath, asthma ,    Pulmonary exam normal breath sounds clear to auscultation       Cardiovascular Exercise Tolerance: Good hypertension, (-) angina(-) Past MI and (-) DOE Normal cardiovascular exam Rhythm:regular Rate:Normal     Neuro/Psych  Headaches, negative psych ROS   GI/Hepatic Neg liver ROS, GERD  Controlled,  Endo/Other  negative endocrine ROS  Renal/GU      Musculoskeletal  (+) Arthritis ,   Abdominal   Peds  Hematology negative hematology ROS (+)   Anesthesia Other Findings Past Medical History: No date: Anemia No date: Arthritis No date: Asthma No date: Cancer (HCC)     Comment: BASAL CELL NOSE No date: Complication of anesthesia No date: GERD (gastroesophageal reflux disease) No date: Headache     Comment: MIGRAINES No date: History of kidney stones No date: Hypertension No date: PONV (postoperative nausea and vomiting)     Comment: NAUSEATED  Past Surgical History: No date: CESAREAN SECTION No date: MOHS SURGERY  BMI    Body Mass Index:  49.44 kg/m      Reproductive/Obstetrics negative OB ROS                             Anesthesia Physical Anesthesia Plan  ASA: III  Anesthesia Plan: General ETT   Post-op Pain Management:    Induction: Intravenous  Airway Management Planned: Oral ETT  Additional Equipment:   Intra-op Plan:   Post-operative Plan: Extubation in OR  Informed Consent: I have reviewed the patients History and Physical, chart, labs and  discussed the procedure including the risks, benefits and alternatives for the proposed anesthesia with the patient or authorized representative who has indicated his/her understanding and acceptance.   Dental Advisory Given  Plan Discussed with: Anesthesiologist, CRNA and Surgeon  Anesthesia Plan Comments: (Patient consented for risks of anesthesia including but not limited to:  - adverse reactions to medications - damage to teeth, lips or other oral mucosa - sore throat or hoarseness - Damage to heart, brain, lungs or loss of life  Patient voiced understanding.)        Anesthesia Quick Evaluation

## 2016-10-26 NOTE — Op Note (Signed)
Total Laparoscopic Hysterectomy Operative Note Procedure Date: 10/26/2016  Patient:  Makayla Gonzalez  56 y.o. female  PRE-OPERATIVE DIAGNOSIS:  PMB  POST-OPERATIVE DIAGNOSIS:  PMB  PROCEDURE:  Procedure(s): HYSTERECTOMY TOTAL LAPAROSCOPIC (N/A) LAPAROSCOPIC BILATERAL SALPINGO OOPHORECTOMY (Bilateral)  Cystoscopy  SURGEON:  Surgeon(s) and Role:    * Treniya Lobb C Jai Bear, MD - Primary    * Benjaman Kindler, MD - Assisting  ANESTHESIA:  General via ET  I/O  Total I/O In: 1000 [I.V.:1000] Out: 200 [Urine:150; Blood:50]  FINDINGS:  Redundant bowel, falling into pelvis.  Small uterus, normal ovaries and surgically altered fallopian tubes bilaterally.  No anterior adhesions in the upper abdomen.  SPECIMEN: Uterus, Cervix, and bilateral fallopian tubes  COMPLICATIONS: none apparent  DISPOSITION: vital signs stable to PACU  Indication for Surgery: 56 y.o. G2 who has had menopausal bleeding x 2.  She requested a hysterectomy to definitely resolve her symptoms.  Risks of surgery were discussed with the patient including but not limited to: bleeding which may require transfusion or reoperation; infection which may require antibiotics; injury to bowel, bladder, ureters or other surrounding organs; need for additional procedures including laparotomy, blood clot, incisional problems and other postoperative/anesthesia complications. Written informed consent was obtained.      PROCEDURE IN DETAIL:  The patient had 5000u Heparin Sub-q and sequential compression devices applied to her lower extremities while in the preoperative area.  She was then taken to the operating room. IV antibiotics were given. General anesthesia was administered via endotracheal route.  She was placed in the dorsal lithotomy position, and was prepped and draped in a sterile manner. A surgical time-out was performed.  A Foley catheter was inserted into her bladder and attached to constant drainage and a V-Care uterine manipulator  was then advanced into the uterus and a good fit around the cervix was noted. The gloves were changed, and attention was turned to the abdomen where an umbilical incision was made with the scalpel.  A 10mm trochar was inserted in the umbilical incision using a visiport method.Opening pressure was 59mmHg, and the abdomen was insufflated to 15mg Hg carbon dioxide gas and adequate pneumoperitoneum was obtained. A survey of the patient's pelvis and abdomen revealed the findings as mentioned above. Two 42mm ports were inserted in the lower left and right quadrants under visualization.    The bilateral Round ligaments and utero-ovarian ligaments were cauterized and transected using the Ligasure. The anterior broad ligament divided and brought across the uterus to separate the vesicouterine peritoneum and create a bladder flap. The bladder was pushed away from the uterus. The bilateral uterine arteries were skeletonized, ligated and transected. The bilateral uterosacral and cardinal ligaments were ligated and transected. A colpotomy was made around the V-Care cervical cup. The bilateral IP ligaments were doubly cauterized and divided with the Ligasure.  The pedicles were hemostatic. The uterus, cervix, and bilateral tubes and ovaries were removed through the vagina. The vaginal cuff was closed vaginally using 0-Vicryl in a running locking stitch. This was tested for integrity using the surgeon's finger. After a change of gloves, the pneumoperitoneum was recreated and surgical site inspected, and found to be hemostatic.Evaseal was spread over the peritoneum at the vaginal cuff. No intraoperative injury to surrounding organs was noted. The abdomen was desufflated and all instruments were then removed.   The attention was turned to the bladder.  A 70-degree cystoscope was inserted into the urethra after the foley was removed.  The bladder was inflated with 300cc of normal saline, and  inspection of the bladder wall  was performed.  Bilateral ureteral jets were observed.  The cystoscope was removed.  All skin incisions were closed with 4-0 monocryl and covered with surgical glue. The patient tolerated the procedures well.  All instruments, needles, and sponge counts were correct x 2. The patient was taken to the recovery room in stable condition.   ---- Larey Days, MD Attending Obstetrician and [facility]

## 2016-10-26 NOTE — Transfer of Care (Signed)
Immediate Anesthesia Transfer of Care Note  Patient: Makayla Gonzalez  Procedure(s) Performed: Procedure(s): HYSTERECTOMY TOTAL LAPAROSCOPIC (N/A) LAPAROSCOPIC BILATERAL SALPINGO OOPHORECTOMY (Bilateral)  Patient Location: PACU  Anesthesia Type:General  Level of Consciousness: sedated  Airway & Oxygen Therapy: Patient Spontanous Breathing and Patient connected to nasal cannula oxygen  Post-op Assessment: Report given to RN and Post -op Vital signs reviewed and stable  Post vital signs: Reviewed and stable  Last Vitals:  Vitals:   10/26/16 0612 10/26/16 1027  BP: (!) 152/75 (!) 109/50  Pulse: (!) 102 91  Resp: 18 14  Temp: (!) 35.8 C 36.8 C    Last Pain:  Vitals:   10/26/16 1027  TempSrc: Temporal  PainSc: Asleep         Complications: No apparent anesthesia complications

## 2016-10-26 NOTE — Anesthesia Postprocedure Evaluation (Signed)
Anesthesia Post Note  Patient: Makayla Gonzalez  Procedure(s) Performed: Procedure(s) (LRB): HYSTERECTOMY TOTAL LAPAROSCOPIC (N/A) LAPAROSCOPIC BILATERAL SALPINGO OOPHORECTOMY (Bilateral)  Patient location during evaluation: PACU Anesthesia Type: General Level of consciousness: awake and alert Pain management: pain level controlled Vital Signs Assessment: post-procedure vital signs reviewed and stable Respiratory status: spontaneous breathing, nonlabored ventilation, respiratory function stable and patient connected to nasal cannula oxygen Cardiovascular status: blood pressure returned to baseline and stable Postop Assessment: no signs of nausea or vomiting Anesthetic complications: no     Last Vitals:  Vitals:   10/26/16 1138 10/26/16 1155  BP: (!) 100/58 (!) 107/52  Pulse: 91 84  Resp: 11 16  Temp: 36.8 C 36.4 C    Last Pain:  Vitals:   10/26/16 1155  TempSrc: Temporal  PainSc: 2                  Precious Haws Piscitello

## 2016-10-26 NOTE — Discharge Instructions (Signed)
Discharge instructions:  Call office if you have any of the following: fever >101 F, chills, excessive vaginal bleeding, incision drainage or problems, leg pain or redness, or any other concerns.   Activity: Do not lift > 10 lbs for 8 weeks.  No intercourse or tampons for 8 weeks.  No driving for 1-2 weeks.   You may feel some pain in your upper right abdomen/rib and right shoulder.  This is from the gas in the abdomen for surgery. This will subside over time, please be patient!  Take 600mg  Ibuprofen and 1000mg  Tylenol around the clock, every 6 hours for at least the first 3-5 days.  After this you can take as needed.  This will help decrease inflammation and promote healing.  The narcotics you'll take just as needed, as they just trick your brain into thinking its not in pain.    Please don't limit yourself in terms of routine activity.  You will be able to do most things, although they may take longer to do or be a little painful.  You can do it!  AMBULATORY SURGERY  DISCHARGE INSTRUCTIONS   1) The drugs that you were given will stay in your system until tomorrow so for the next 24 hours you should not:  A) Drive an automobile B) Make any legal decisions C) Drink any alcoholic beverage   2) You may resume regular meals tomorrow.  Today it is better to start with liquids and gradually work up to solid foods.  You may eat anything you prefer, but it is better to start with liquids, then soup and crackers, and gradually work up to solid foods.   3) Please notify your doctor immediately if you have any unusual bleeding, trouble breathing, redness and pain at the surgery site, drainage, fever, or pain not relieved by medication.    4) Additional Instructions:        Please contact your physician with any problems or Same Day Surgery at [phone], Monday through Friday 6 am to 4 pm, or [place] at [facility] number at [phone].  Don't be a hero, but don't be a wimp  either!

## 2016-10-29 LAB — SURGICAL PATHOLOGY

## 2017-01-21 ENCOUNTER — Other Ambulatory Visit: Payer: Self-pay | Admitting: Obstetrics & Gynecology

## 2017-01-21 DIAGNOSIS — R109 Unspecified abdominal pain: Secondary | ICD-10-CM

## 2017-01-24 ENCOUNTER — Ambulatory Visit
Admission: RE | Admit: 2017-01-24 | Discharge: 2017-01-24 | Disposition: A | Discharge status: Home / Self Care | Payer: BLUE CROSS/BLUE SHIELD | Source: Ambulatory Visit | Attending: Obstetrics & Gynecology | Admitting: Obstetrics & Gynecology

## 2017-01-24 ENCOUNTER — Encounter: Payer: Self-pay | Admitting: Radiology

## 2017-01-24 DIAGNOSIS — I700 Atherosclerosis of aorta: Secondary | ICD-10-CM | POA: Diagnosis not present

## 2017-01-24 DIAGNOSIS — R109 Unspecified abdominal pain: Secondary | ICD-10-CM | POA: Insufficient documentation

## 2017-01-24 DIAGNOSIS — K760 Fatty (change of) liver, not elsewhere classified: Secondary | ICD-10-CM | POA: Insufficient documentation

## 2017-01-24 MED ORDER — IOPAMIDOL (ISOVUE-300) INJECTION 61%
150.0000 mL | Freq: Once | INTRAVENOUS | Status: AC | PRN
  Administered 2017-01-24: 125 mL via INTRAVENOUS

## 2017-02-14 ENCOUNTER — Emergency Department: Payer: BLUE CROSS/BLUE SHIELD

## 2017-02-14 ENCOUNTER — Encounter: Payer: Self-pay | Admitting: Medical Oncology

## 2017-02-14 ENCOUNTER — Emergency Department
Admission: EM | Admit: 2017-02-14 | Discharge: 2017-02-14 | Disposition: A | Discharge status: Home / Self Care | Payer: BLUE CROSS/BLUE SHIELD | Attending: Emergency Medicine | Admitting: Emergency Medicine

## 2017-02-14 DIAGNOSIS — R0602 Shortness of breath: Secondary | ICD-10-CM | POA: Insufficient documentation

## 2017-02-14 DIAGNOSIS — R079 Chest pain, unspecified: Secondary | ICD-10-CM | POA: Insufficient documentation

## 2017-02-14 DIAGNOSIS — J45909 Unspecified asthma, uncomplicated: Secondary | ICD-10-CM | POA: Insufficient documentation

## 2017-02-14 DIAGNOSIS — R51 Headache: Secondary | ICD-10-CM | POA: Insufficient documentation

## 2017-02-14 DIAGNOSIS — Z79899 Other long term (current) drug therapy: Secondary | ICD-10-CM | POA: Insufficient documentation

## 2017-02-14 DIAGNOSIS — I10 Essential (primary) hypertension: Secondary | ICD-10-CM | POA: Insufficient documentation

## 2017-02-14 LAB — CBC
HEMATOCRIT: 39.9 % (ref 35.0–47.0)
HEMOGLOBIN: 13.6 g/dL (ref 12.0–16.0)
MCH: 28.4 pg (ref 26.0–34.0)
MCHC: 34.2 g/dL (ref 32.0–36.0)
MCV: 83 fL (ref 80.0–100.0)
Platelets: 260 10*3/uL (ref 150–440)
RBC: 4.81 MIL/uL (ref 3.80–5.20)
RDW: 14.3 % (ref 11.5–14.5)
WBC: 5.8 10*3/uL (ref 3.6–11.0)

## 2017-02-14 LAB — BASIC METABOLIC PANEL
ANION GAP: 9 (ref 5–15)
BUN: 8 mg/dL (ref 6–20)
CHLORIDE: 101 mmol/L (ref 101–111)
CO2: 27 mmol/L (ref 22–32)
Calcium: 8.9 mg/dL (ref 8.9–10.3)
Creatinine, Ser: 0.61 mg/dL (ref 0.44–1.00)
GFR calc non Af Amer: >60 mL/min (ref >60)
GLUCOSE: 105 mg/dL — AB (ref 65–99)
POTASSIUM: 3.6 mmol/L (ref 3.5–5.1)
Sodium: 137 mmol/L (ref 135–145)

## 2017-02-14 LAB — BRAIN NATRIURETIC PEPTIDE: B Natriuretic Peptide: 90 pg/mL (ref 0.0–100.0)

## 2017-02-14 LAB — TROPONIN I

## 2017-02-14 MED ORDER — ACETAMINOPHEN 325 MG PO TABS
ORAL_TABLET | ORAL | Status: AC
  Administered 2017-02-14: 650 mg via ORAL
  Filled 2017-02-14: qty 2

## 2017-02-14 MED ORDER — ACETAMINOPHEN 325 MG PO TABS
650.0000 mg | ORAL_TABLET | Freq: Once | ORAL | Status: AC
  Administered 2017-02-14: 650 mg via ORAL

## 2017-02-14 NOTE — Discharge Instructions (Signed)
The tests we did here today looked okay.I will ask you to follow up with cardiology just to double check and make sure. Dr. Mabeline Caras the cardiologist can see at 2:00 tomorrow.  I will also ask you to follow-up with Dr. Edison Pace the ophthalmologist. He can check on the little bit of decreased vision in your 1 eye. yYou can go at 1pm on Monday the 27th.

## 2017-02-14 NOTE — ED Provider Notes (Addendum)
[facility] Emergency Department Provider Note   ____________________________________________   First MD Initiated Contact with Patient 02/14/17 1102     (approximate)  I have reviewed the triage vital signs and the nursing notes.   HISTORY  Chief Complaint Hypertension and Chest Pain   HPI Makayla Gonzalez is a 56 y.o. female Who reports her son committed suicide some time ago. Sunday she had to go through her sons things and got very upset. She developed a dull achy headache and may be some blurry vision since then. The headache is frontal and in both temples not even as serious as some of the other headaches she had. She also has had some chest pressure which is constant since its onset on Sunday waxing and waning associated with shortness of breath and nausea but not sweating.patient is spent the last few days in bed   Past Medical History:  Diagnosis Date  . Anemia   . Arthritis   . Asthma   . Cancer (HCC)    BASAL CELL NOSE  . Complication of anesthesia   . GERD (gastroesophageal reflux disease)   . Headache    MIGRAINES  . History of kidney stones   . Hypertension   . PONV (postoperative nausea and vomiting)    NAUSEATED    There are no active problems to display for this patient.   Past Surgical History:  Procedure Laterality Date  . CESAREAN SECTION    . CYSTOSCOPY  10/26/2016   Procedure: CYSTOSCOPY;  Surgeon: Ward, Honor Loh, MD;  Location: [facility] ORS;  Service: Gynecology;;  . LAPAROSCOPIC BILATERAL SALPINGO OOPHERECTOMY Bilateral 10/26/2016   Procedure: LAPAROSCOPIC BILATERAL SALPINGO OOPHORECTOMY;  Surgeon: Ward, Honor Loh, MD;  Location: [facility] ORS;  Service: Gynecology;  Laterality: Bilateral;  . LAPAROSCOPIC HYSTERECTOMY N/A 10/26/2016   Procedure: HYSTERECTOMY TOTAL LAPAROSCOPIC;  Surgeon: Ward, Honor Loh, MD;  Location: [facility] ORS;  Service: Gynecology;  Laterality: N/A;  . MOHS SURGERY      Prior to Admission medications     Medication Sig Start Date End Date Taking? Authorizing Provider  albuterol (PROVENTIL HFA;VENTOLIN HFA) 108 (90 Base) MCG/ACT inhaler Inhale 2 puffs into the lungs every 6 (six) hours as needed for wheezing or shortness of breath.   Yes [provider]  FLUoxetine (PROZAC) 40 MG capsule Take 80 mg by mouth every morning.    Yes [provider]  losartan (COZAAR) 50 MG tablet Take 50 mg by mouth every morning.    Yes [provider]  omeprazole (PRILOSEC) 20 MG capsule Take 40 mg by mouth every morning.    Yes [provider]  acetaminophen (TYLENOL) 500 MG tablet Take 2 tablets (1,000 mg total) by mouth every 6 (six) hours. Patient not taking: Reported on 02/14/2017 10/26/16   Ward, Honor Loh, MD  ibuprofen (ADVIL,MOTRIN) 600 MG tablet Take 1 tablet (600 mg total) by mouth every 6 (six) hours. Patient not taking: Reported on 02/14/2017 10/26/16   Ward, Honor Loh, MD  oxyCODONE (ROXICODONE) 5 MG immediate release tablet Take 1 tablet (5 mg total) by mouth every 4 (four) hours as needed for severe pain. Patient not taking: Reported on 02/14/2017 10/26/16   Ward, Honor Loh, MD    Allergies Patient has no known allergies.  No family history on file.  Social History Social History  Substance Use Topics  . Smoking status: Never Smoker  . Smokeless tobacco: Never Used  . Alcohol use No    Review of Systems  Constitutional: No fever/chills Eyes: No visual changes. ENT: No sore throat. Cardiovascular:  chest pain. Respiratory:  shortness of breath. Gastrointestinal: No abdominal pain.  No nausea, no vomiting.  No diarrhea.  No constipation. Genitourinary: Negative for dysuria. Musculoskeletal: Negative for back pain. Skin: Negative for rash. Neurological: Negative for focal weakness    ____________________________________________   PHYSICAL EXAM:  VITAL SIGNS: ED Triage Vitals  Enc Vitals Group     BP 02/14/17 1042 126/69     Pulse Rate 02/14/17  1042 90     Resp 02/14/17 1042 18     Temp 02/14/17 1042 98 F (36.7 C)     Temp Source 02/14/17 1042 Oral     SpO2 02/14/17 1042 96 %     Weight 02/14/17 1043 290 lb (131.5 kg)     Height 02/14/17 1043 5\' 4"  (1.626 m)     Head Circumference --      Peak Flow --      Pain Score 02/14/17 1041 4     Pain Loc --      Pain Edu? --      Excl. in [place]? --     Constitutional: Alert and oriented. Well appearing and in no acute distress. Eyes: Conjunctivae are normal. PERRL. EOMI.fundi look normal Head: Atraumatic. Nose: No congestion/rhinnorhea. Mouth/Throat: Mucous membranes are moist.  Oropharynx non-erythematous. Neck: No stridor Cardiovascular: Normal rate, regular rhythm. Grossly normal heart sounds.  Good peripheral circulation. Respiratory: Normal respiratory effort.  No retractions. Lungs CTAB. Gastrointestinal: Soft and nontender. No distention. No abdominal bruits. No CVA tenderness. }Musculoskeletal: No lower extremity tenderness nor edema.  No joint effusions. Neurologic:  Normal speech and language. No gross focal neurologic deficits are appreciated. No gait instability. Skin:  Skin is warm, dry and intact. No rash noted. Psychiatric: Mood and affect are normal. Speech and behavior are normal.  ____________________________________________   LABS (all labs ordered are listed, but only abnormal results are displayed)  Labs Reviewed  BASIC METABOLIC PANEL - Abnormal; Notable for the following:       Result Value   Glucose, Bld 105 (*)    All other components within normal limits  CBC  TROPONIN I  BRAIN NATRIURETIC PEPTIDE   ____________________________________________  EKG   ____________________________________________  RADIOLOGY  Dg Chest 2 View  Result Date: 02/14/2017 CLINICAL DATA:  Onset of chest pressure 2 days ago without shortness of breath. Remote history of bronchitis. Never smoked. History of asthma and hypertension. EXAM: CHEST  2 VIEW COMPARISON:   Chest x-ray of August 25, 2013 and PA and lateral chest x-ray of July 30, 2012. FINDINGS: The lungs are adequately inflated. The interstitial markings are coarse and more conspicuous than on the previous study. There is no alveolar infiltrate or pleural effusion. The heart and pulmonary vascularity are normal. There is calcification in the wall of the aortic arch. There is no pleural effusion. The bony thorax exhibits no acute abnormality. IMPRESSION: Bronchitic or reactive airway changes with increased interstitial prominence since the previous studies. No alveolar pneumonia nor CHF. Thoracic aortic atherosclerosis. If the patient's symptoms persist and remain unexplained, chest CT scanning would be a useful next imaging step. Electronically Signed   By: David  Martinique M.D.   On: 02/14/2017 11:09   Ct Head Wo Contrast  Result Date: 02/14/2017 CLINICAL DATA:  Headache.  Blurred vision.  History of migraine. EXAM: CT HEAD WITHOUT CONTRAST TECHNIQUE: Contiguous axial images were obtained from the base of the skull through the vertex without  intravenous contrast. COMPARISON:  None. FINDINGS: Brain: No mass lesion, hemorrhage, hydrocephalus, acute infarct, intra-axial, or extra-axial fluid collection. Vascular: No hyperdense vessel or unexpected calcification. Skull: Normal Sinuses/Orbits: Normal imaged portions of the orbits and globes. Clear paranasal sinuses and mastoid air cells. Other: None. IMPRESSION: Normal head CT. Electronically Signed   By: Abigail Miyamoto M.D.   On: 02/14/2017 13:31    FINDINGS: Brain: No mass lesion, hemorrhage, hydrocephalus, acute infarct, intra-axial, or extra-axial fluid collection.  Vascular: No hyperdense vessel or unexpected calcification.  Skull: Normal  Sinuses/Orbits: Normal imaged portions of the orbits and globes. Clear paranasal sinuses and mastoid air cells.  Other: None.  IMPRESSION: Normal head CT.   Electronically Signed   By: Abigail Miyamoto  M.D.   On: 02/14/2017 13:31 ____________________________________________   PROCEDURES  Procedure(s) performed:visual acuity is 2050 in the left eye and 20/20 in the right eye she reports the nurse walked from room 55 year old room 1 without any difficulty  Procedures  Critical Care performed:   ____________________________________________   INITIAL IMPRESSION / [place] / ED COURSE  Pertinent labs & imaging results that were available during my care of the patient were reviewed by me and considered in my medical decision making (see chart for details).        ____________________________________________   FINAL CLINICAL IMPRESSION(S) / ED DIAGNOSES  Final diagnoses:  Nonspecific chest pain      NEW MEDICATIONS STARTED DURING THIS VISIT:  New Prescriptions   No medications on file     Note:  This document was prepared using Dragon voice recognition software and may include unintentional dictation errors.    Nena Polio, MD 02/14/17 1349    Nena Polio, MD 02/14/17 [phone]

## 2017-02-14 NOTE — ED Notes (Signed)
Pt upset that edp did not spend time talking to her about test results, potential diagnosis prior to discharge. edp was called away from room by phone call and did not return. I spent several minutes listening to pt. Pt refused to let me get edp to come talk to her.

## 2017-02-14 NOTE — ED Triage Notes (Signed)
Pt reports that she got upset Sunday and since then has noted her blood pressure to be elevated and having headache. Pt states that she began about 2 days ago having pressure to her chest. Pt denies sob.

## 2017-12-03 ENCOUNTER — Other Ambulatory Visit: Payer: Self-pay | Admitting: Pediatrics

## 2017-12-03 DIAGNOSIS — Z1239 Encounter for other screening for malignant neoplasm of breast: Secondary | ICD-10-CM

## 2017-12-09 ENCOUNTER — Ambulatory Visit
Admission: RE | Admit: 2017-12-09 | Discharge: 2017-12-09 | Disposition: A | Discharge status: Home / Self Care | Payer: BLUE CROSS/BLUE SHIELD | Source: Ambulatory Visit | Attending: Pediatrics | Admitting: Pediatrics

## 2017-12-09 DIAGNOSIS — Z1231 Encounter for screening mammogram for malignant neoplasm of breast: Secondary | ICD-10-CM | POA: Diagnosis not present

## 2017-12-09 DIAGNOSIS — Z1239 Encounter for other screening for malignant neoplasm of breast: Secondary | ICD-10-CM

## 2018-03-21 ENCOUNTER — Ambulatory Visit
Admission: EM | Admit: 2018-03-21 | Discharge: 2018-03-21 | Disposition: A | Discharge status: Home / Self Care | Payer: BLUE CROSS/BLUE SHIELD | Attending: Family Medicine | Admitting: Family Medicine

## 2018-03-21 ENCOUNTER — Ambulatory Visit (INDEPENDENT_AMBULATORY_CARE_PROVIDER_SITE_OTHER): Payer: BLUE CROSS/BLUE SHIELD

## 2018-03-21 ENCOUNTER — Encounter: Payer: Self-pay | Admitting: Emergency Medicine

## 2018-03-21 ENCOUNTER — Other Ambulatory Visit: Payer: Self-pay

## 2018-03-21 DIAGNOSIS — W1830XA Fall on same level, unspecified, initial encounter: Secondary | ICD-10-CM

## 2018-03-21 DIAGNOSIS — S93602A Unspecified sprain of left foot, initial encounter: Secondary | ICD-10-CM

## 2018-03-21 MED ORDER — ACETAMINOPHEN-CODEINE #3 300-30 MG PO TABS: 1.0000 | ORAL_TABLET | Freq: Three times a day (TID) | ORAL | 0 refills | Status: AC | PRN

## 2018-03-21 NOTE — ED Triage Notes (Signed)
Patient states that she fell on wet floor at her home about 30 min ago. Patient c/o pain in her left foot and left 1st toe.

## 2018-03-21 NOTE — Discharge Instructions (Signed)
Rest, ice, elevation.  Medication as needed.  Take care  Dr. Shaunna Rosetti  

## 2018-03-21 NOTE — ED Provider Notes (Signed)
[facility]    CSN: [id number] Arrival date & time: 03/21/18  1244  History   Chief Complaint Chief Complaint  Patient presents with  . Foot Pain  . Fall   HPI   57 year old female presents with a foot injury.  Patient slipped on a wet floor earlier today.  She states that she slipped and fell injuring her left foot.  Patient reports pain which is moderate in severity.  Located on top of the foot and the medial aspect of the foot.  He has no medications or interventions tried.  Worse with activity.  No relieving factors.  No other complaints.  PMH, Surgical Hx, Family Hx, Social History reviewed and updated as below.  Past Medical History:  Diagnosis Date  . Anemia   . Arthritis   . Asthma   . Cancer (HCC)    BASAL CELL NOSE  . Complication of anesthesia   . GERD (gastroesophageal reflux disease)   . Headache    MIGRAINES  . History of kidney stones   . Hypertension   . PONV (postoperative nausea and vomiting)    NAUSEATED    Past Surgical History:  Procedure Laterality Date  . CESAREAN SECTION    . CYSTOSCOPY  10/26/2016   Procedure: CYSTOSCOPY;  Surgeon: Ward, Honor Loh, MD;  Location: [facility] ORS;  Service: Gynecology;;  . LAPAROSCOPIC BILATERAL SALPINGO OOPHERECTOMY Bilateral 10/26/2016   Procedure: LAPAROSCOPIC BILATERAL SALPINGO OOPHORECTOMY;  Surgeon: Ward, Honor Loh, MD;  Location: [facility] ORS;  Service: Gynecology;  Laterality: Bilateral;  . LAPAROSCOPIC HYSTERECTOMY N/A 10/26/2016   Procedure: HYSTERECTOMY TOTAL LAPAROSCOPIC;  Surgeon: Ward, Honor Loh, MD;  Location: [facility] ORS;  Service: Gynecology;  Laterality: N/A;  . MOHS SURGERY      OB History   None      Home Medications    Prior to Admission medications   Medication Sig Start Date End Date Taking? Authorizing Provider  FLUoxetine (PROZAC) 40 MG capsule Take 80 mg by mouth every morning.    Yes [provider]  losartan (COZAAR) 50 MG tablet Take 50 mg by mouth every morning.    Yes  [provider]  omeprazole (PRILOSEC) 20 MG capsule Take 40 mg by mouth every morning.    Yes [provider]  acetaminophen-codeine (TYLENOL #3) 300-30 MG tablet Take 1 tablet by mouth every 8 (eight) hours as needed for moderate pain. 03/21/18   Coral Spikes, DO  albuterol (PROVENTIL HFA;VENTOLIN HFA) 108 (90 Base) MCG/ACT inhaler Inhale 2 puffs into the lungs every 6 (six) hours as needed for wheezing or shortness of breath.    [provider]    Family History Family History  Problem Relation Age of Onset  . Breast cancer Cousin 30       mat cousin    Social History Social History   Tobacco Use  . Smoking status: Never Smoker  . Smokeless tobacco: Never Used  Substance Use Topics  . Alcohol use: No  . Drug use: No   Allergies   Metformin and Ace inhibitors  Review of Systems Review of Systems  Constitutional: Negative.   Musculoskeletal:       Foot pain (left).   Physical Exam Triage Vital Signs ED Triage Vitals  Enc Vitals Group     BP 03/21/18 1258 (!) 141/72     Pulse Rate 03/21/18 1258 94     Resp 03/21/18 1258 16     Temp 03/21/18 1258 98.2 F (36.8 C)  Temp Source 03/21/18 1258 Oral     SpO2 03/21/18 1258 95 %     Weight 03/21/18 1255 280 lb (127 kg)     Height 03/21/18 1255 5\' 4"  (1.626 m)     Head Circumference --      Peak Flow --      Pain Score 03/21/18 1255 8     Pain Loc --      Pain Edu? --      Excl. in [place]? --    Updated Vital Signs BP (!) 141/72 (BP Location: Left Arm)   Pulse 94   Temp 98.2 F (36.8 C) (Oral)   Resp 16   Ht 5\' 4"  (1.626 m)   Wt 127 kg   SpO2 95%   BMI 48.06 kg/m   Visual Acuity Right Eye Distance:   Left Eye Distance:   Bilateral Distance:    Right Eye Near:   Left Eye Near:    Bilateral Near:     Physical Exam  Constitutional: She is oriented to person, place, and time. She appears well-developed. No distress.  HENT:  Head: Normocephalic and atraumatic.  Pulmonary/Chest:  Effort normal. No respiratory distress.  Musculoskeletal:  Left foot -no visible swelling.  Patient has mild tenderness of the toes.   Left ankle -no swelling.  No erythema.  Normal range of motion.  Neurological: She is alert and oriented to person, place, and time.  Psychiatric: She has a normal mood and affect. Her behavior is normal.  Nursing note and vitals reviewed.  UC Treatments / Results  Labs (all labs ordered are listed, but only abnormal results are displayed) Labs Reviewed - No data to display  EKG None  Radiology Dg Foot Complete Left  Result Date: 03/21/2018 CLINICAL DATA:  Pt slipped on wet floor this am and fell injuring left foot. Most pain side and around the 1st MTP jt area but hurts into foot EXAM: LEFT FOOT - COMPLETE 3+ VIEW COMPARISON:  05/20/2010 FINDINGS: No fracture.  No bone lesion. Joints normally spaced and aligned. No significant arthropathic change. Plantar calcaneal spur, increased in size from prior radiographs. Soft tissues are unremarkable. IMPRESSION: No fracture or dislocation. Electronically Signed   By: Lajean Manes M.D.   On: 03/21/2018 13:27    Procedures Procedures (including critical care time)  Medications Ordered in UC Medications - No data to display  Initial Impression / Assessment and Plan / UC Course  I have reviewed the triage vital signs and the nursing notes.  Pertinent labs & imaging results that were available during my care of the patient were reviewed by me and considered in my medical decision making (see chart for details).    57 year old female presents with a left foot injury.  X-ray negative.  Advised rest, ice, elevation.  Tylenol with codeine as needed for severe pain.  Supportive care. [place] controlled substance database reviewed. No concerns for abuse.  Final Clinical Impressions(s) / UC Diagnoses   Final diagnoses:  Sprain of left foot, initial encounter     Discharge Instructions     Rest, ice,  elevation.  Medication as needed.  Take care  Dr. Lacinda Axon    ED Prescriptions    Medication Sig Dispense Auth. Provider   acetaminophen-codeine (TYLENOL #3) 300-30 MG tablet Take 1 tablet by mouth every 8 (eight) hours as needed for moderate pain. 10 tablet Coral Spikes, DO     Controlled Substance Prescriptions [place] Controlled Substance Registry consulted? Yes, I have consulted  the [place] Controlled Substances Registry for this patient, and feel the risk/benefit ratio today is favorable for proceeding with this prescription for a controlled substance.   Coral Spikes, [place] 03/21/18 1744

## 2019-03-03 IMAGING — CT CT ABD-PEL WO/W CM
2 of 10 series · 12 of 46 positions shown, 19 images · IV contrast (iopamidol)
Comparison: 05/27/2008

CLINICAL DATA: Left flank pain.

EXAM:
CT ABDOMEN AND PELVIS WITHOUT AND WITH CONTRAST
TECHNIQUE: Multidetector CT imaging of the abdomen and pelvis was performed
following the standard protocol before and following the bolus
administration of intravenous contrast.
CONTRAST:  125mL LMP7QG-JTT IOPAMIDOL (LMP7QG-JTT) INJECTION 61%

[Series 4: abd pel with · axial · 0.72mm/px · z∈[-847,-457]mm · 9 of 98 slices shown, 15 images]
[im 10/98  soft-tissue]
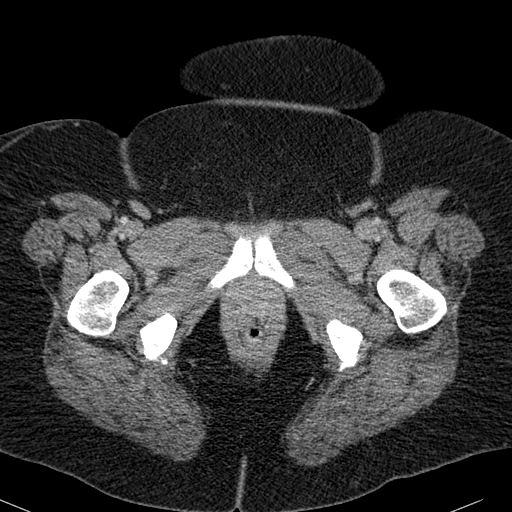
[im 10/98  bone]
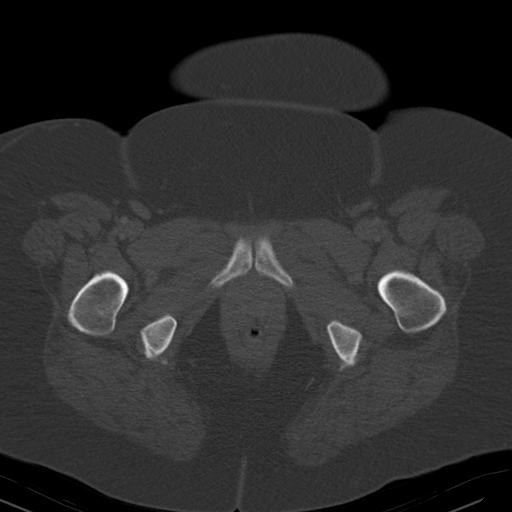
[im 20/98  soft-tissue]
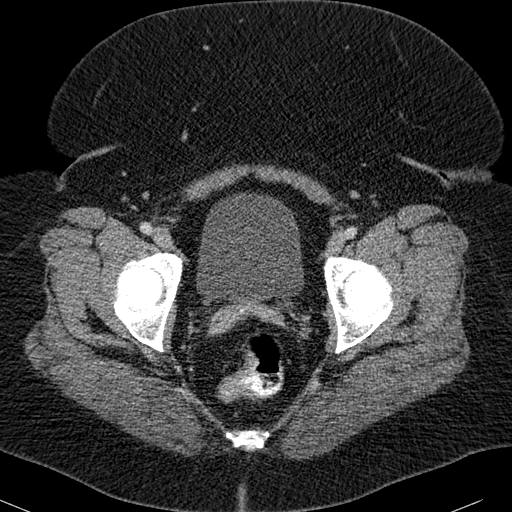
[im 30/98  soft-tissue]
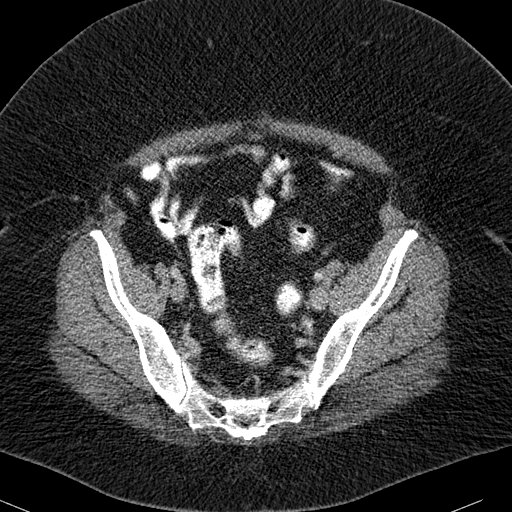
[im 39/98  soft-tissue]
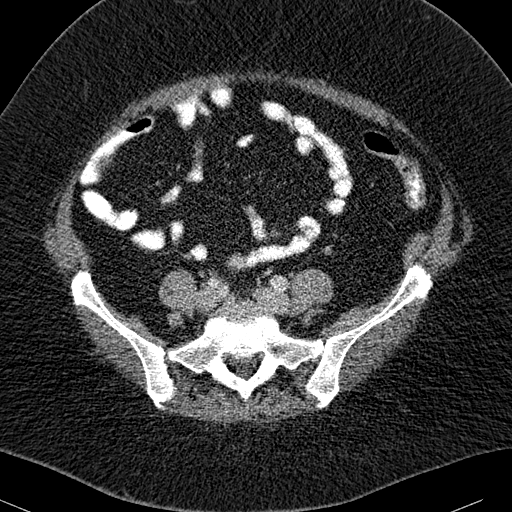
[im 49/98  soft-tissue]
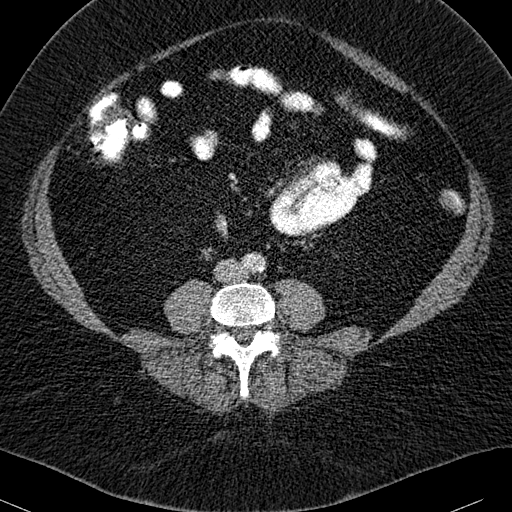
[im 59/98  soft-tissue]
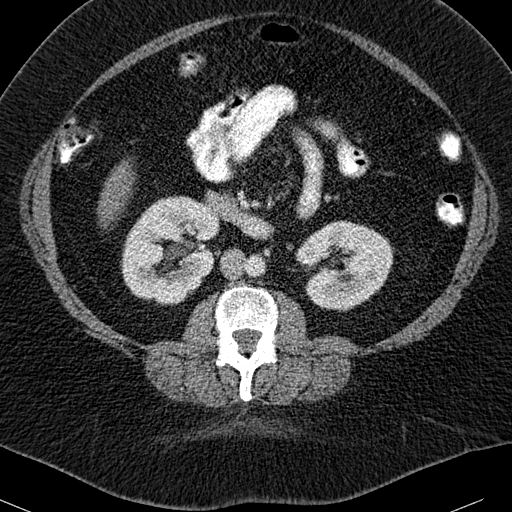
[im 59/98  lung]
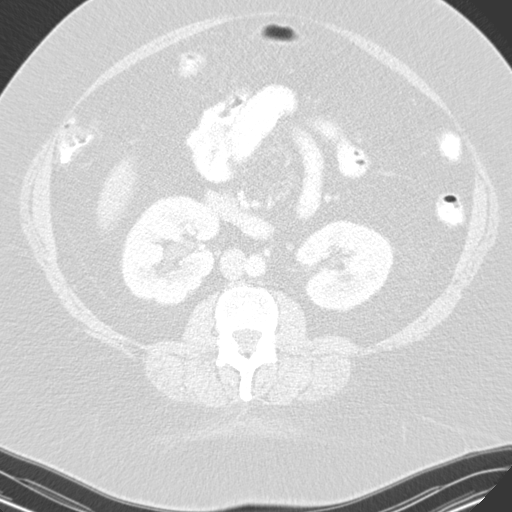
[im 68/98  soft-tissue]
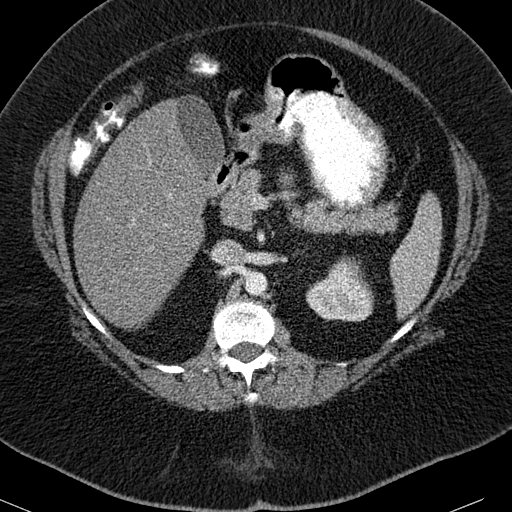
[im 68/98  lung]
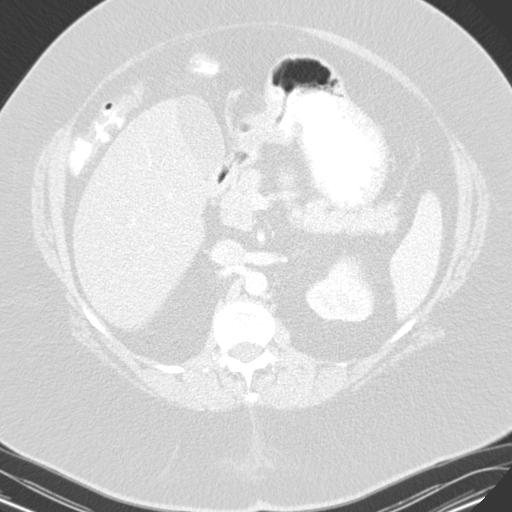
[im 78/98  soft-tissue]
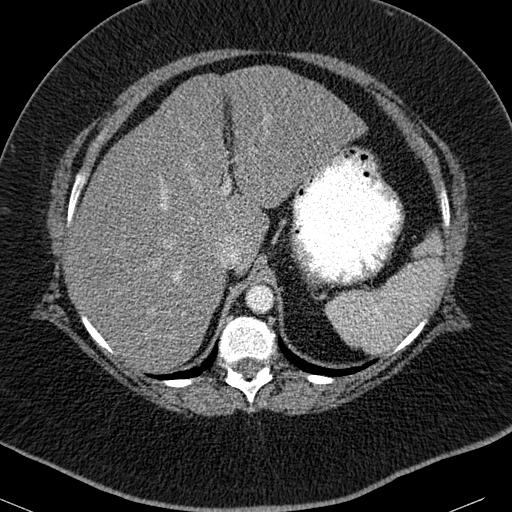
[im 78/98  lung]
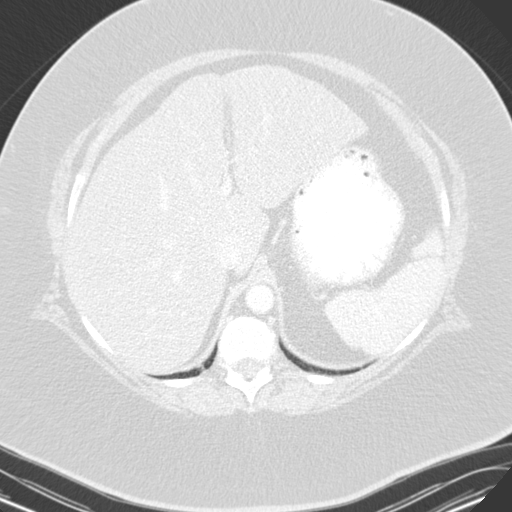
[im 88/98  soft-tissue]
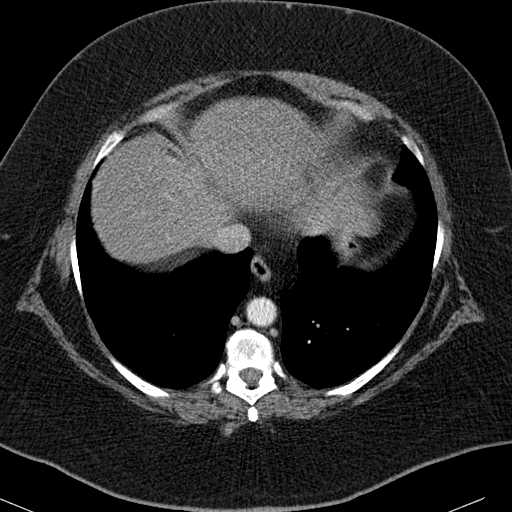
[im 88/98  lung]
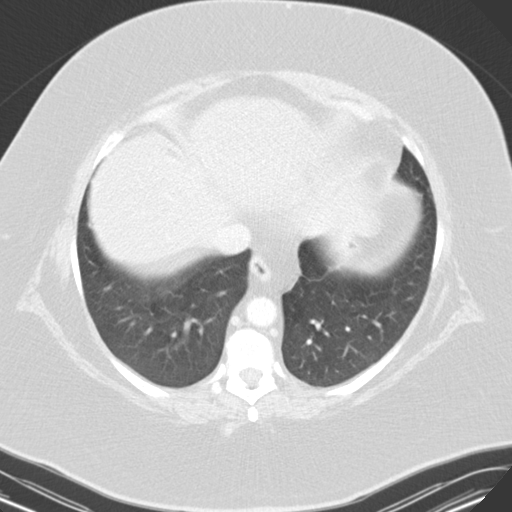
[im 88/98  bone]
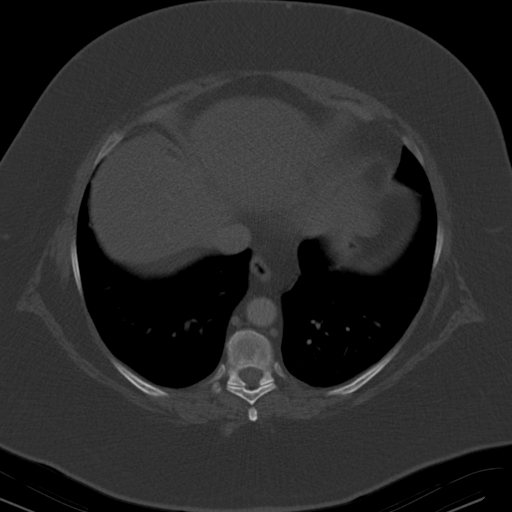

[Series 602: coronal · coronal · 0.95mm/px · 3 of 149 slices shown, 4 images]
[im 38/149  soft-tissue]
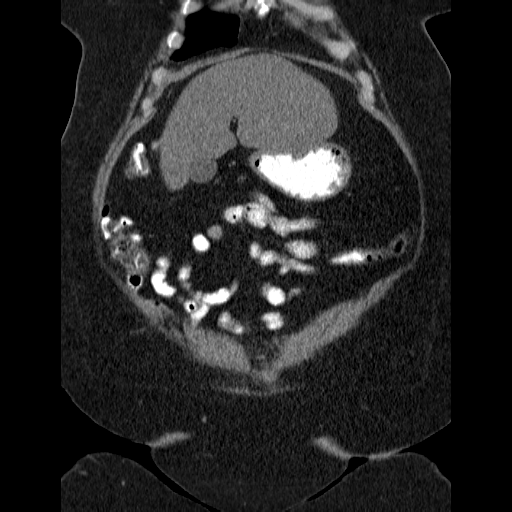
[im 75/149  soft-tissue]
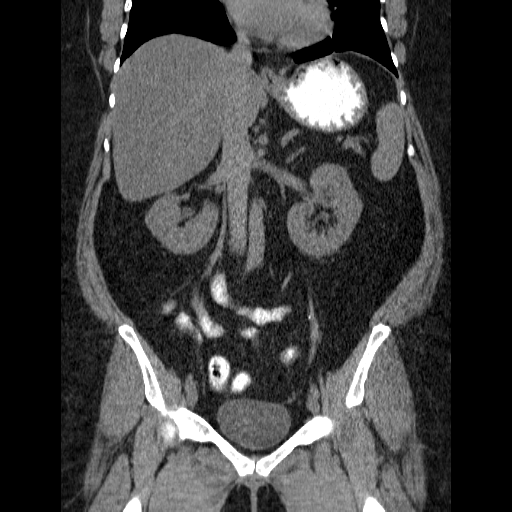
[im 75/149  bone]
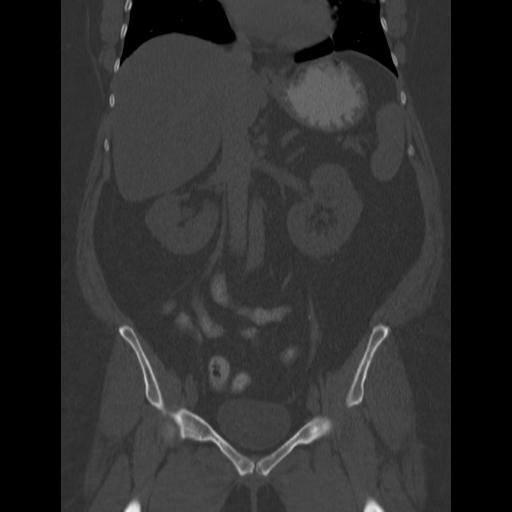
[im 112/149  soft-tissue]
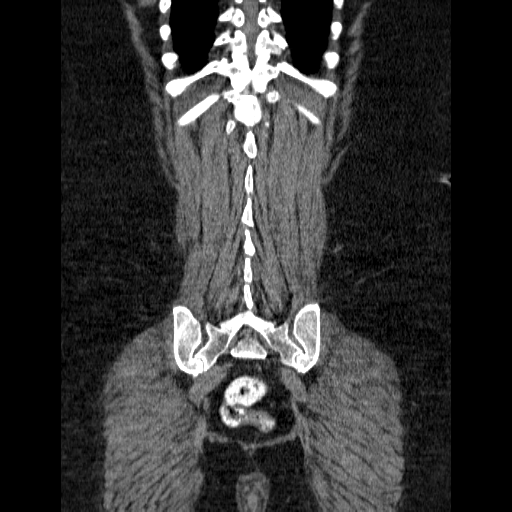

[12 of 46 positions shown; findings below may reference images not displayed]

FINDINGS: Lower chest: No acute abnormality.

Hepatobiliary: Hepatic steatosis is identified. The gallbladder
appears within normal limits. No biliary dilatation identified.

Pancreas: The pancreas is unremarkable.

Spleen: Normal appearance of the spleen.

Adrenals/Urinary Tract: Normal adrenal glands. No urinary tract
calculi identified.

Stomach/Bowel: Stomach is within normal limits. No evidence of bowel
wall thickening, distention, or inflammatory changes.

Vascular/Lymphatic: Aortic atherosclerosis. No aneurysm. No enlarged
upper abdominal lymph nodes. No pelvic or inguinal adenopathy.

Reproductive: Status post hysterectomy. No adnexal masses.

Other: No abdominal wall hernia or abnormality. No abdominopelvic
ascites.

Musculoskeletal: Degenerative disc disease is identified within the
lumbar spine. No aggressive lytic or sclerotic bone lesion.
IMPRESSION: 1. No acute findings within the abdomen or pelvis. No explanation
for patient's left flank pain.
2. Hepatic steatosis
3.  Aortic Atherosclerosis ([id number]).

## 2020-04-01 ENCOUNTER — Ambulatory Visit
Admission: EM | Admit: 2020-04-01 | Discharge: 2020-04-01 | Disposition: A | Discharge status: Home / Self Care | Payer: BLUE CROSS/BLUE SHIELD | Attending: Family Medicine | Admitting: Family Medicine

## 2020-04-01 ENCOUNTER — Ambulatory Visit (INDEPENDENT_AMBULATORY_CARE_PROVIDER_SITE_OTHER): Payer: BLUE CROSS/BLUE SHIELD

## 2020-04-01 ENCOUNTER — Other Ambulatory Visit: Payer: Self-pay

## 2020-04-01 DIAGNOSIS — R059 Cough, unspecified: Secondary | ICD-10-CM

## 2020-04-01 DIAGNOSIS — J189 Pneumonia, unspecified organism: Secondary | ICD-10-CM | POA: Diagnosis not present

## 2020-04-01 DIAGNOSIS — U071 COVID-19: Secondary | ICD-10-CM | POA: Diagnosis not present

## 2020-04-01 MED ORDER — BENZONATATE 200 MG PO CAPS: 200.0000 mg | ORAL_CAPSULE | Freq: Three times a day (TID) | ORAL | 0 refills | Status: DC | PRN

## 2020-04-01 MED ORDER — AZITHROMYCIN 250 MG PO TABS: ORAL_TABLET | ORAL | 0 refills | Status: DC

## 2020-04-01 MED ORDER — AMOXICILLIN-POT CLAVULANATE 875-125 MG PO TABS: 1.0000 | ORAL_TABLET | Freq: Two times a day (BID) | ORAL | 0 refills | Status: DC

## 2020-04-01 NOTE — ED Triage Notes (Signed)
Patient states that she is covid positive as of 9/27 un-vaccinated, states that she went for the covid infusion on Monday 03/28/2020. States that over the last few days she has worsened and has noticed chest crackles. Patient is concerned that now she is in Covid Pneumonia.

## 2020-04-01 NOTE — Discharge Instructions (Signed)
Medication as prescribed.  Take care  Dr. Tyjuan Demetro  

## 2020-04-01 NOTE — ED Provider Notes (Signed)
[facility]    CSN: [id number] Arrival date & time: 04/01/20  0905      History   Chief Complaint Chief Complaint  Patient presents with  . Covid Positive  . Cough   HPI  59 year old female presents with cough, fatigue in the setting of Covid 19.  Patient was diagnosed with Covid on 9/27.  She had a monoclonal antibody infusion on 10/4.  Patient states that she seems to be doing okay but feels like it is "settling in my chest".  She states that she has noticed some crackles at times.  She is concerned that she has pneumonia.  No fever.  She does note dry cough and fatigue.  No other associated symptoms.  Normal oxygen saturations at home.  No other complaints at this time.  Past Medical History:  Diagnosis Date  . Anemia   . Arthritis   . Asthma   . Cancer (HCC)    BASAL CELL NOSE  . Complication of anesthesia   . GERD (gastroesophageal reflux disease)   . Headache    MIGRAINES  . History of kidney stones   . Hypertension   . PONV (postoperative nausea and vomiting)    NAUSEATED   Past Surgical History:  Procedure Laterality Date  . CESAREAN SECTION    . CYSTOSCOPY  10/26/2016   Procedure: CYSTOSCOPY;  Surgeon: Ward, Honor Loh, MD;  Location: [facility];  Service: Gynecology;;  . LAPAROSCOPIC BILATERAL SALPINGO OOPHERECTOMY Bilateral 10/26/2016   Procedure: LAPAROSCOPIC BILATERAL SALPINGO OOPHORECTOMY;  Surgeon: Ward, Honor Loh, MD;  Location: [facility];  Service: Gynecology;  Laterality: Bilateral;  . LAPAROSCOPIC HYSTERECTOMY N/A 10/26/2016   Procedure: HYSTERECTOMY TOTAL LAPAROSCOPIC;  Surgeon: Ward, Honor Loh, MD;  Location: [facility];  Service: Gynecology;  Laterality: N/A;  . MOHS SURGERY     OB History   No obstetric history on file.     Home Medications    Prior to Admission medications   Medication Sig Start Date End Date Taking? Authorizing Provider  acetaminophen-codeine (TYLENOL #3) 300-30 MG tablet Take 1 tablet by mouth every 8 (eight) hours  as needed for moderate pain. 03/21/18  Yes Rhylynn Perdomo G, DO  albuterol (PROVENTIL HFA;VENTOLIN HFA) 108 (90 Base) MCG/ACT inhaler Inhale 2 puffs into the lungs every 6 (six) hours as needed for wheezing or shortness of breath.   Yes [provider]  FLUoxetine (PROZAC) 40 MG capsule Take 80 mg by mouth every morning.    Yes [provider]  losartan (COZAAR) 50 MG tablet Take 50 mg by mouth every morning.    Yes [provider]  omeprazole (PRILOSEC) 20 MG capsule Take 40 mg by mouth every morning.    Yes [provider]  amoxicillin-clavulanate (AUGMENTIN) 875-125 MG tablet Take 1 tablet by mouth 2 (two) times daily. 04/01/20   Coral Spikes, DO  azithromycin (ZITHROMAX) 250 MG tablet 2 tablets on day 1, then 1 tablet daily on days 2-5. 04/01/20   Coral Spikes, DO  benzonatate (TESSALON) 200 MG capsule Take 1 capsule (200 mg total) by mouth 3 (three) times daily as needed for cough. 04/01/20   Coral Spikes, DO    Family History Family History  Problem Relation Age of Onset  . Breast cancer Cousin 53       mat cousin    Social History Social History   Tobacco Use  . Smoking status: Never Smoker  . Smokeless tobacco: Never Used  Vaping Use  .  Vaping Use: Never used  Substance Use Topics  . Alcohol use: No  . Drug use: No     Allergies   Metformin and Ace inhibitors   Review of Systems Review of Systems  Constitutional: Positive for fatigue. Negative for fever.  Respiratory: Positive for cough.    Physical Exam Triage Vital Signs ED Triage Vitals  Enc Vitals Group     BP 04/01/20 0921 129/60     Pulse Rate 04/01/20 0921 82     Resp 04/01/20 0921 18     Temp 04/01/20 0921 98.2 F (36.8 C)     Temp Source 04/01/20 0921 Oral     SpO2 04/01/20 0921 100 %     Weight 04/01/20 0919 198 lb (89.8 kg)     Height 04/01/20 0919 5\' 5"  (1.651 m)     Head Circumference --      Peak Flow --      Pain Score 04/01/20 0919 3     Pain Loc --       Pain Edu? --      Excl. in [place]? --    Updated Vital Signs BP 129/60 (BP Location: Right Arm)   Pulse 82   Temp 98.2 F (36.8 C) (Oral)   Resp 18   Ht 5\' 5"  (1.651 m)   Wt 89.8 kg   SpO2 100%   BMI 32.95 kg/m   Visual Acuity Right Eye Distance:   Left Eye Distance:   Bilateral Distance:    Right Eye Near:   Left Eye Near:    Bilateral Near:     Physical Exam Vitals and nursing note reviewed.  Constitutional:      General: She is not in acute distress.    Appearance: Normal appearance. She is obese. She is not ill-appearing.  HENT:     Head: Normocephalic and atraumatic.  Eyes:     General:        Right eye: No discharge.        Left eye: No discharge.     Conjunctiva/sclera: Conjunctivae normal.  Cardiovascular:     Rate and Rhythm: Normal rate and regular rhythm.     Heart sounds: No murmur heard.   Pulmonary:     Effort: Pulmonary effort is normal.     Breath sounds: Normal breath sounds. No wheezing, rhonchi or rales.  Neurological:     Mental Status: She is alert.  Psychiatric:        Mood and Affect: Mood normal.        Behavior: Behavior normal.     UC Treatments / Results  Labs (all labs ordered are listed, but only abnormal results are displayed) Labs Reviewed - No data to display  EKG   Radiology DG Chest 2 View  Result Date: 04/01/2020 CLINICAL DATA:  Cough.  COVID positive. EXAM: CHEST - 2 VIEW COMPARISON:  02/14/2017 FINDINGS: Mild hyperexpansion. Small focus of ground-glass airspace disease in the peripheral left mid lung. No pulmonary edema or pleural effusion. The cardiopericardial silhouette is within normal limits for size. The visualized bony structures of the thorax show no acute abnormality. IMPRESSION: small focus of ground-glass airspace disease in the peripheral left mid lung. Follow-up recommended to ensure resolution. Electronically Signed   By: Misty Stanley M.D.   On: 04/01/2020 10:02    Procedures Procedures (including  critical care time)  Medications Ordered in UC Medications - No data to display  Initial Impression / Assessment and Plan / UC Course  I have reviewed the triage vital signs and the nursing notes.  Pertinent labs & imaging results that were available during my care of the patient were reviewed by me and considered in my medical decision making (see chart for details).    59 year old female presents with ongoing cough in the setting of COVID-19.  Chest x-ray obtained.  It was independently reviewed by me.  There is a small area of opacity of the left midlung.  Secondary bacterial pneumonia versus Covid pneumonia.  Treating empirically with Augmentin and azithromycin.  Tessalon Perles for cough.  Final Clinical Impressions(s) / UC Diagnoses   Final diagnoses:  Pneumonia of left lung due to infectious organism, unspecified part of lung     Discharge Instructions     Medication as prescribed.  Take care  Dr. Lacinda Axon    ED Prescriptions    Medication Sig Dispense Auth. Provider   amoxicillin-clavulanate (AUGMENTIN) 875-125 MG tablet Take 1 tablet by mouth 2 (two) times daily. 20 tablet Demyah Smyre G, DO   azithromycin (ZITHROMAX) 250 MG tablet 2 tablets on day 1, then 1 tablet daily on days 2-5. 6 tablet Skilynn Durney G, DO   benzonatate (TESSALON) 200 MG capsule Take 1 capsule (200 mg total) by mouth 3 (three) times daily as needed for cough. 30 capsule Coral Spikes, DO     PDMP not reviewed this encounter.   Coral Spikes, [place] 04/01/20 1048

## 2020-05-28 ENCOUNTER — Other Ambulatory Visit: Payer: Self-pay

## 2020-05-28 ENCOUNTER — Ambulatory Visit
Admission: EM | Admit: 2020-05-28 | Discharge: 2020-05-28 | Disposition: A | Discharge status: Home / Self Care | Payer: BLUE CROSS/BLUE SHIELD

## 2020-05-28 DIAGNOSIS — R197 Diarrhea, unspecified: Secondary | ICD-10-CM

## 2020-05-28 DIAGNOSIS — R109 Unspecified abdominal pain: Secondary | ICD-10-CM

## 2020-05-28 MED ORDER — DICYCLOMINE HCL 20 MG PO TABS: 20.0000 mg | ORAL_TABLET | Freq: Two times a day (BID) | ORAL | 0 refills | Status: AC

## 2020-05-28 NOTE — ED Provider Notes (Signed)
[facility]    CSN: [id number] Arrival date & time: 05/28/20  0800      History   Chief Complaint Chief Complaint  Patient presents with  . Diarrhea    HPI Makayla Gonzalez is a 59 y.o. female.   HPI   59 year old female here for evaluation of diarrhea, headache, abdominal cramps, and left arm redness and soreness.  Patient reports that she received her second Shingrix vaccine on May 26, 2020 and the following day she developed the soreness and redness to her left arm at the injection site.  She also reports that she developed liquid diarrhea with 15-20 stools yesterday she denies blood in any of the stools.  Patient had 4 stools since 7 AM this morning.  It is now 8:44 AM.  Patient denies vomiting or fever.  Patient has had nausea and is able to eat and drink but says she does not want to.  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  She is supposed to be taking probiotics but has slacked off over the last 6 weeks.  Patient is status post Roux-en-Y.  Patient's last colonoscopy was in December 2020 and showed no diverticular disease.  Patient did have friable mucosa at the Roux-en-Y site.  No samples were taken.  Patient is complaining of some generalized abdominal cramping with focus on the left side of her abdomen.  Past Medical History:  Diagnosis Date  . Anemia   . Arthritis   . Asthma   . Cancer (HCC)    BASAL CELL NOSE  . Complication of anesthesia   . GERD (gastroesophageal reflux disease)   . Headache    MIGRAINES  . History of kidney stones   . Hypertension   . PONV (postoperative nausea and vomiting)    NAUSEATED    There are no problems to display for this patient.   Past Surgical History:  Procedure Laterality Date  . CESAREAN SECTION    . CYSTOSCOPY  10/26/2016   Procedure: CYSTOSCOPY;  Surgeon: Ward, Honor Loh, MD;  Location: [facility];  Service: Gynecology;;  . LAPAROSCOPIC BILATERAL SALPINGO OOPHERECTOMY Bilateral  10/26/2016   Procedure: LAPAROSCOPIC BILATERAL SALPINGO OOPHORECTOMY;  Surgeon: Ward, Honor Loh, MD;  Location: [facility];  Service: Gynecology;  Laterality: Bilateral;  . LAPAROSCOPIC HYSTERECTOMY N/A 10/26/2016   Procedure: HYSTERECTOMY TOTAL LAPAROSCOPIC;  Surgeon: Ward, Honor Loh, MD;  Location: [facility];  Service: Gynecology;  Laterality: N/A;  . MOHS SURGERY      OB History   No obstetric history on file.      Home Medications    Prior to Admission medications   Medication Sig Start Date End Date Taking? Authorizing Provider  acetaminophen-codeine (TYLENOL #3) 300-30 MG tablet Take 1 tablet by mouth every 8 (eight) hours as needed for moderate pain. 03/21/18  Yes Cook, Jayce G, DO  albuterol (PROVENTIL HFA;VENTOLIN HFA) 108 (90 Base) MCG/ACT inhaler Inhale 2 puffs into the lungs every 6 (six) hours as needed for wheezing or shortness of breath.   Yes [provider]  FLUoxetine (PROZAC) 40 MG capsule Take 80 mg by mouth every morning.    Yes [provider]  losartan (COZAAR) 50 MG tablet Take 50 mg by mouth every morning.    Yes [provider]  Multiple Vitamins-Minerals (ONE DAILY CALCIUM/IRON) TABS Take 1 tablet by mouth daily. 01/25/20  Yes [provider]  omeprazole (PRILOSEC) 20 MG capsule Take 40 mg by mouth every morning.  Yes [provider]  ALPRAZolam (XANAX) 0.25 MG tablet Take 0.25 mg by mouth daily as needed. 05/18/20   [provider]  carvedilol (COREG) 3.125 MG tablet Take 3.125 mg by mouth 2 (two) times daily. 04/21/20   [provider]  colestipol (COLESTID) 1 g tablet Take 1 tablet by mouth in the morning and at bedtime. 03/04/20   [provider]  dicyclomine (BENTYL) 20 MG tablet Take 1 tablet (20 mg total) by mouth 2 (two) times daily. 05/28/20   Margarette Canada, NP  ondansetron (ZOFRAN-ODT) 4 MG disintegrating tablet Take 1 tablet by mouth every 8 (eight) hours as needed. 04/05/20   [provider]    Family History Family History  Problem Relation Age of Onset  . Breast cancer Cousin 48       mat cousin    Social History Social History   Tobacco Use  . Smoking status: Never Smoker  . Smokeless tobacco: Never Used  Vaping Use  . Vaping Use: Never used  Substance Use Topics  . Alcohol use: No  . Drug use: No     Allergies   Metformin and Ace inhibitors   Review of Systems Review of Systems  Constitutional: Negative for activity change, appetite change and fever.  Respiratory: Negative for shortness of breath and wheezing.   Cardiovascular: Negative for chest pain.  Gastrointestinal: Positive for abdominal pain, diarrhea and nausea. Negative for blood in stool and vomiting.  Genitourinary: Negative for dysuria, frequency and urgency.  Musculoskeletal: Negative for arthralgias and myalgias.  Skin: Negative for rash.  Neurological: Negative for syncope and headaches.  Hematological: Negative.   Psychiatric/Behavioral: Negative.      Physical Exam Triage Vital Signs ED Triage Vitals  Enc Vitals Group     BP      Pulse      Resp      Temp      Temp src      SpO2      Weight      Height      Head Circumference      Peak Flow      Pain Score      Pain Loc      Pain Edu?      Excl. in [place]?    No data found.  Updated Vital Signs BP (!) 141/70 (BP Location: Right Arm)   Pulse 75   Temp 98.4 F (36.9 C) (Oral)   Ht 5\' 5"  (1.651 m)   Wt 190 lb (86.2 kg)   SpO2 98%   BMI 31.62 kg/m   Visual Acuity Right Eye Distance:   Left Eye Distance:   Bilateral Distance:    Right Eye Near:   Left Eye Near:    Bilateral Near:     Physical Exam Vitals and nursing note reviewed.  Constitutional:      General: She is not in acute distress.    Appearance: Normal appearance. She is not toxic-appearing.  HENT:     Head: Normocephalic and atraumatic.     Mouth/Throat:     Mouth: Mucous membranes are dry.     Pharynx: Oropharynx is clear.  No oropharyngeal exudate or posterior oropharyngeal erythema.     Comments: Oral mucous membranes are pink and moist. Eyes:     General: No scleral icterus.    Conjunctiva/sclera: Conjunctivae normal.     Pupils: Pupils are equal, round, and reactive to light.     Comments: Ocular sclera are  moist and shiny.  Cardiovascular:     Rate and Rhythm: Normal rate and regular rhythm.     Pulses: Normal pulses.     Heart sounds: Normal heart sounds. No murmur heard.  No gallop.   Pulmonary:     Effort: Pulmonary effort is normal.     Breath sounds: Normal breath sounds. No wheezing or rales.  Abdominal:     General: Bowel sounds are normal. There is no distension.     Palpations: Abdomen is soft.     Tenderness: There is abdominal tenderness. There is no right CVA tenderness, left CVA tenderness, guarding or rebound.     Hernia: No hernia is present.     Comments: Patient complains of mild tenderness upon palpation of her left abdomen, upper and lower quadrant.  Patient has no guarding on exam.  Musculoskeletal:        General: No swelling or tenderness. Normal range of motion.  Skin:    General: Skin is warm and dry.     Capillary Refill: Capillary refill takes less than 2 seconds.     Findings: No erythema or rash.  Neurological:     General: No focal deficit present.     Mental Status: She is alert and oriented to person, place, and time.  Psychiatric:        Mood and Affect: Mood normal.        Behavior: Behavior normal.        Thought Content: Thought content normal.        Judgment: Judgment normal.      UC Treatments / Results  Labs (all labs ordered are listed, but only abnormal results are displayed) Labs Reviewed - No data to display  EKG   Radiology No results found.  Procedures Procedures (including critical care time)  Medications Ordered in UC Medications - No data to display  Initial Impression / Assessment and Plan / UC Course  I have reviewed the  triage vital signs and the nursing notes.  Pertinent labs & imaging results that were available during my care of the patient were reviewed by me and considered in my medical decision making (see chart for details).   Patient is here for evaluation of GI symptoms after receiving her second Shingrix vaccine.  Patient states that when she had her first vaccine she had flulike symptoms and went up in the bed for couple of days.  This time around she has the redness and soreness to her left arm.  The site is mildly erythematous without induration.  The area is mildly tender.  Patient is also complaining of diarrhea abdominal cramping.  Bowel sounds are normal, abdomen is soft, there is mild tenderness in left upper and left lower quadrant.  Patient has no history of diverticular disease and accompanied any blood in her stool.  Patient is able to eat and drink.  Suspect symptoms are secondary to the Shingrix vaccine as all symptoms are present in about 25% of the population in her age group who receive the vaccine.  Patient also has a history of SIBO has not been following her treatment protocol which could exacerbate her diarrhea.  Patient courage to resume her probiotics, focus on hydration with Pedialyte and chicken broth.  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  Will prescribe Bentyl to help with cramping.  Patient advised to return if she has increased abdominal pain, fever, blood in her stool, or vomiting and inability to keep down food  or fluids.   Final Clinical Impressions(s) / UC Diagnoses   Final diagnoses:  Diarrhea, unspecified type  Abdominal cramping     Discharge Instructions     Use the Bentyl every 6 hours as needed for abdominal cramping.  Continue to focus on rehydration using Pedialyte, broth, and ginger ale.  You may eat what ever you want and adding fiber to your diet may help with the diarrhea.  Resume your probiotics as prescribed by your  gastroenterologist.  If you develop increasing abdominal pain, fever, bloody stools, or vomiting and inability to keep down food or fluids return for reevaluation or go to the ER.    ED Prescriptions    Medication Sig Dispense Auth. Provider   dicyclomine (BENTYL) 20 MG tablet Take 1 tablet (20 mg total) by mouth 2 (two) times daily. 20 tablet Margarette Canada, NP     PDMP not reviewed this encounter.   Margarette Canada, NP 05/28/20 [phone]

## 2020-05-28 NOTE — Discharge Instructions (Addendum)
Use the Bentyl every 6 hours as needed for abdominal cramping.  Continue to focus on rehydration using Pedialyte, broth, and ginger ale.  You may eat what ever you want and adding fiber to your diet may help with the diarrhea.  Resume your probiotics as prescribed by your gastroenterologist.  If you develop increasing abdominal pain, fever, bloody stools, or vomiting and inability to keep down food or fluids return for reevaluation or go to the ER.

## 2020-05-28 NOTE — ED Triage Notes (Signed)
Pt states she received 2nd shingles vax this past Thu, She states she had a possible mild reaction to the first shot with fever, body aches and not feeling well. Pt reports this time she has diarrhea, headache, abdominal cramps that started Thursday night. Pt does have frequent GI upset. Pt denies fever. She also reports feeling very weak. Pt states her left arm is sore and has some redness.
